# Patient Record
Sex: Female | Born: 1935 | Smoking: Never smoker
Health system: Southern US, Community
[De-identification: ages and names within clinical notes are randomized; demographics above are authoritative.]

## PROBLEM LIST (undated history)

## (undated) DIAGNOSIS — K449 Diaphragmatic hernia without obstruction or gangrene: Secondary | ICD-10-CM

## (undated) DIAGNOSIS — K5900 Constipation, unspecified: Secondary | ICD-10-CM

## (undated) DIAGNOSIS — K219 Gastro-esophageal reflux disease without esophagitis: Secondary | ICD-10-CM

## (undated) DIAGNOSIS — I10 Essential (primary) hypertension: Secondary | ICD-10-CM

## (undated) DIAGNOSIS — M5137 Other intervertebral disc degeneration, lumbosacral region: Secondary | ICD-10-CM

## (undated) DIAGNOSIS — G20A1 Parkinson's disease without dyskinesia, without mention of fluctuations: Secondary | ICD-10-CM

## (undated) DIAGNOSIS — M159 Polyosteoarthritis, unspecified: Secondary | ICD-10-CM

## (undated) DIAGNOSIS — IMO0001 Reserved for inherently not codable concepts without codable children: Secondary | ICD-10-CM

## (undated) DIAGNOSIS — I1310 Hypertensive heart and chronic kidney disease without heart failure, with stage 1 through stage 4 chronic kidney disease, or unspecified chronic kidney disease: Secondary | ICD-10-CM

## (undated) DIAGNOSIS — M51379 Other intervertebral disc degeneration, lumbosacral region without mention of lumbar back pain or lower extremity pain: Secondary | ICD-10-CM

## (undated) DIAGNOSIS — J309 Allergic rhinitis, unspecified: Secondary | ICD-10-CM

## (undated) DIAGNOSIS — E785 Hyperlipidemia, unspecified: Secondary | ICD-10-CM

## (undated) DIAGNOSIS — G20 Parkinson's disease: Secondary | ICD-10-CM

## (undated) DIAGNOSIS — I739 Peripheral vascular disease, unspecified: Secondary | ICD-10-CM

## (undated) DIAGNOSIS — N189 Chronic kidney disease, unspecified: Secondary | ICD-10-CM

## (undated) DIAGNOSIS — N390 Urinary tract infection, site not specified: Secondary | ICD-10-CM

## (undated) DIAGNOSIS — M069 Rheumatoid arthritis, unspecified: Secondary | ICD-10-CM

## (undated) DIAGNOSIS — M810 Age-related osteoporosis without current pathological fracture: Secondary | ICD-10-CM

## (undated) DIAGNOSIS — F331 Major depressive disorder, recurrent, moderate: Secondary | ICD-10-CM

## (undated) HISTORY — DX: Chronic kidney disease, unspecified: N18.9

## (undated) HISTORY — DX: Urinary tract infection, site not specified: N39.0

## (undated) HISTORY — DX: Allergic rhinitis, unspecified: J30.9

## (undated) HISTORY — DX: Peripheral vascular disease, unspecified: I73.9

## (undated) HISTORY — DX: Polyosteoarthritis, unspecified: M15.9

## (undated) HISTORY — DX: Major depressive disorder, recurrent, moderate: F33.1

## (undated) HISTORY — DX: Hypertensive heart and chronic kidney disease without heart failure, with stage 1 through stage 4 chronic kidney disease, or unspecified chronic kidney disease: I13.10

## (undated) HISTORY — DX: Reserved for inherently not codable concepts without codable children: IMO0001

## (undated) HISTORY — DX: Other intervertebral disc degeneration, lumbosacral region without mention of lumbar back pain or lower extremity pain: M51.379

## (undated) HISTORY — DX: Constipation, unspecified: K59.00

## (undated) HISTORY — DX: Essential (primary) hypertension: I10

## (undated) HISTORY — DX: Age-related osteoporosis without current pathological fracture: M81.0

## (undated) HISTORY — DX: Hyperlipidemia, unspecified: E78.5

## (undated) HISTORY — DX: Diaphragmatic hernia without obstruction or gangrene: K44.9

## (undated) HISTORY — DX: Gastro-esophageal reflux disease without esophagitis: K21.9

## (undated) HISTORY — DX: Other intervertebral disc degeneration, lumbosacral region: M51.37

## (undated) HISTORY — DX: Rheumatoid arthritis, unspecified: M06.9

## (undated) HISTORY — PX: APPENDECTOMY: SHX54

---

## 2012-04-08 DIAGNOSIS — G20 Parkinson's disease: Secondary | ICD-10-CM

## 2012-04-08 DIAGNOSIS — I1310 Hypertensive heart and chronic kidney disease without heart failure, with stage 1 through stage 4 chronic kidney disease, or unspecified chronic kidney disease: Secondary | ICD-10-CM

## 2012-04-08 DIAGNOSIS — M069 Rheumatoid arthritis, unspecified: Secondary | ICD-10-CM

## 2012-04-08 DIAGNOSIS — G20A1 Parkinson's disease without dyskinesia, without mention of fluctuations: Secondary | ICD-10-CM

## 2012-04-08 DIAGNOSIS — N189 Chronic kidney disease, unspecified: Secondary | ICD-10-CM

## 2012-04-14 ENCOUNTER — Other Ambulatory Visit: Payer: Self-pay | Admitting: *Deleted

## 2012-04-14 MED ORDER — FENTANYL 50 MCG/HR TD PT72: MEDICATED_PATCH | TRANSDERMAL | Status: DC

## 2012-05-05 ENCOUNTER — Other Ambulatory Visit: Payer: Self-pay | Admitting: *Deleted

## 2012-05-05 MED ORDER — FENTANYL 50 MCG/HR TD PT72: MEDICATED_PATCH | TRANSDERMAL | Status: DC

## 2012-05-24 ENCOUNTER — Other Ambulatory Visit: Payer: Self-pay | Admitting: *Deleted

## 2012-05-24 MED ORDER — HYDROMORPHONE HCL 4 MG PO TABS: ORAL_TABLET | ORAL | Status: DC

## 2012-06-08 ENCOUNTER — Other Ambulatory Visit: Payer: Self-pay | Admitting: *Deleted

## 2012-06-08 MED ORDER — FENTANYL 25 MCG/HR TD PT72: 1.0000 | MEDICATED_PATCH | TRANSDERMAL | Status: DC

## 2012-08-03 ENCOUNTER — Encounter: Payer: Self-pay | Admitting: Internal Medicine

## 2012-08-05 DIAGNOSIS — N390 Urinary tract infection, site not specified: Secondary | ICD-10-CM

## 2012-08-05 DIAGNOSIS — G20 Parkinson's disease: Secondary | ICD-10-CM

## 2012-08-12 DIAGNOSIS — I1310 Hypertensive heart and chronic kidney disease without heart failure, with stage 1 through stage 4 chronic kidney disease, or unspecified chronic kidney disease: Secondary | ICD-10-CM | POA: Insufficient documentation

## 2012-08-12 DIAGNOSIS — M810 Age-related osteoporosis without current pathological fracture: Secondary | ICD-10-CM | POA: Insufficient documentation

## 2012-08-12 DIAGNOSIS — IMO0001 Reserved for inherently not codable concepts without codable children: Secondary | ICD-10-CM | POA: Insufficient documentation

## 2012-08-12 DIAGNOSIS — G20 Parkinson's disease: Secondary | ICD-10-CM | POA: Insufficient documentation

## 2012-08-12 DIAGNOSIS — I739 Peripheral vascular disease, unspecified: Secondary | ICD-10-CM | POA: Insufficient documentation

## 2012-08-12 DIAGNOSIS — M5137 Other intervertebral disc degeneration, lumbosacral region: Secondary | ICD-10-CM | POA: Insufficient documentation

## 2012-08-12 DIAGNOSIS — N390 Urinary tract infection, site not specified: Secondary | ICD-10-CM | POA: Insufficient documentation

## 2012-08-12 DIAGNOSIS — M069 Rheumatoid arthritis, unspecified: Secondary | ICD-10-CM | POA: Insufficient documentation

## 2012-08-12 DIAGNOSIS — M159 Polyosteoarthritis, unspecified: Secondary | ICD-10-CM | POA: Insufficient documentation

## 2012-08-12 DIAGNOSIS — K5900 Constipation, unspecified: Secondary | ICD-10-CM | POA: Insufficient documentation

## 2012-08-12 DIAGNOSIS — N182 Chronic kidney disease, stage 2 (mild): Secondary | ICD-10-CM | POA: Insufficient documentation

## 2012-08-12 DIAGNOSIS — F331 Major depressive disorder, recurrent, moderate: Secondary | ICD-10-CM | POA: Insufficient documentation

## 2012-08-12 DIAGNOSIS — R229 Localized swelling, mass and lump, unspecified: Secondary | ICD-10-CM | POA: Insufficient documentation

## 2012-08-12 DIAGNOSIS — J309 Allergic rhinitis, unspecified: Secondary | ICD-10-CM | POA: Insufficient documentation

## 2012-08-12 DIAGNOSIS — K449 Diaphragmatic hernia without obstruction or gangrene: Secondary | ICD-10-CM | POA: Insufficient documentation

## 2012-08-29 ENCOUNTER — Other Ambulatory Visit: Payer: Self-pay

## 2012-08-29 MED ORDER — FENTANYL 25 MCG/HR TD PT72: 1.0000 | MEDICATED_PATCH | TRANSDERMAL | Status: DC

## 2012-09-07 ENCOUNTER — Other Ambulatory Visit: Payer: Self-pay

## 2012-09-07 MED ORDER — HYDROMORPHONE HCL 4 MG PO TABS: ORAL_TABLET | ORAL | Status: DC

## 2012-09-07 NOTE — Telephone Encounter (Signed)
Verified dose and instructions reflect manual request received by nursing home.   

## 2012-09-26 ENCOUNTER — Other Ambulatory Visit: Payer: Self-pay | Admitting: *Deleted

## 2012-09-26 MED ORDER — FENTANYL 25 MCG/HR TD PT72: 1.0000 | MEDICATED_PATCH | TRANSDERMAL | Status: DC

## 2012-10-03 ENCOUNTER — Other Ambulatory Visit: Payer: Self-pay | Admitting: *Deleted

## 2012-10-03 MED ORDER — FENTANYL 25 MCG/HR TD PT72: 1.0000 | MEDICATED_PATCH | TRANSDERMAL | Status: DC

## 2012-11-21 ENCOUNTER — Other Ambulatory Visit: Payer: Self-pay | Admitting: *Deleted

## 2012-11-21 MED ORDER — FENTANYL 25 MCG/HR TD PT72: 25.0000 ug | MEDICATED_PATCH | TRANSDERMAL | Status: DC

## 2012-12-05 ENCOUNTER — Other Ambulatory Visit: Payer: Self-pay | Admitting: *Deleted

## 2012-12-05 MED ORDER — HYDROMORPHONE HCL 4 MG PO TABS: ORAL_TABLET | ORAL | Status: DC

## 2012-12-19 ENCOUNTER — Other Ambulatory Visit: Payer: Self-pay | Admitting: *Deleted

## 2012-12-19 MED ORDER — FENTANYL 25 MCG/HR TD PT72: 25.0000 ug | MEDICATED_PATCH | TRANSDERMAL | Status: DC

## 2013-01-23 ENCOUNTER — Non-Acute Institutional Stay (SKILLED_NURSING_FACILITY): Payer: PRIVATE HEALTH INSURANCE | Admitting: Internal Medicine

## 2013-01-23 ENCOUNTER — Encounter: Payer: Self-pay | Admitting: Internal Medicine

## 2013-01-23 DIAGNOSIS — G20 Parkinson's disease: Secondary | ICD-10-CM

## 2013-01-23 DIAGNOSIS — I739 Peripheral vascular disease, unspecified: Secondary | ICD-10-CM

## 2013-01-23 DIAGNOSIS — N390 Urinary tract infection, site not specified: Secondary | ICD-10-CM

## 2013-01-23 DIAGNOSIS — F331 Major depressive disorder, recurrent, moderate: Secondary | ICD-10-CM

## 2013-01-23 DIAGNOSIS — N189 Chronic kidney disease, unspecified: Secondary | ICD-10-CM

## 2013-01-23 DIAGNOSIS — I1310 Hypertensive heart and chronic kidney disease without heart failure, with stage 1 through stage 4 chronic kidney disease, or unspecified chronic kidney disease: Secondary | ICD-10-CM

## 2013-01-23 NOTE — Progress Notes (Signed)
MRN: [id number] Name: Sheri Molina  Sex: female Age: 80 y.o. DOB: 04/09/1929  [facility] #: [facility] Facility/Room: 217A Level Of Care: SNF Provider: Merrilee Seashore D Emergency Contacts: Extended Emergency Contact Information Primary Emergency Contact: Contact,No  [place] Home Phone: [phone] Relation: None  Code Status: DNR  Allergies: Ampicillin  Chief Complaint  Patient presents with  . Medical Managment of Chronic Issues    HPI: Patient is 80 y.o. female who is being seen for chronic medical problems.  Past Medical History  Diagnosis Date  . Localized superficial swelling, mass, or lump   . Paralysis agitans   . Urinary tract infection, site not specified   . Allergic rhinitis, cause unspecified   . Major depressive disorder, recurrent episode, moderate   . Diaphragmatic hernia without mention of obstruction or gangrene   . 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 Chronic kidney disease, unspecified   . Unspecified constipation   . Myalgia and myositis, unspecified   . Peripheral vascular disease, unspecified   . Degeneration of lumbar or lumbosacral intervertebral disc   . Rheumatoid arthritis(714.0)   . Senile osteoporosis   . Generalized osteoarthrosis, involving multiple sites     History reviewed. No pertinent past surgical history.    Medication List       This list is accurate as of: 01/23/13  2:17 PM.  Always use your most recent med list.               APOKYN 10 MG/ML Soln  Generic drug:  APOMORPHINE HYDROCHLORIDE  Inject 0.2 mLs into the skin. up to 5 times a day prn increased parkinson's sx     ascorbic acid 1000 MG tablet  Commonly known as:  VITAMIN C  Take 1,000 mg by mouth daily.     calcium carbonate (dosed in mg elemental calcium) 1250 MG/5ML  Take 500 mg of elemental calcium by mouth.     Calcium Carbonate  Antacid 648 MG Tabs  Take 1 tablet by mouth 2 (two) times daily.     carbidopa-levodopa-entacapone 50-200-200 MG per tablet  Commonly known as:  STALEVO  Take 1 tablet by mouth 3 (three) times daily.     DULoxetine 30 MG capsule  Commonly known as:  CYMBALTA  Take 30 mg by mouth daily.     fentaNYL 25 MCG/HR patch  Commonly known as:  DURAGESIC - dosed mcg/hr  Place 1 patch (25 mcg total) onto the skin every 3 (three) days.     FISH OIL BURP-LESS 1000 MG Caps  Take 1 capsule by mouth daily.     GENTEAL 0.3 % Soln  Generic drug:  Hypromellose  Apply 1 drop to eye every 12 (twelve) hours.     HYDROmorphone 4 MG tablet  Commonly known as:  DILAUDID  Take 1 tablet every 8 hours as needed for pain     Melatonin 3 MG Tbdp  Take 1 tablet by mouth at bedtime.     metoprolol succinate 50 MG 24 hr tablet  Commonly known as:  TOPROL-XL  Take 50 mg by mouth daily. Take with or immediately following a meal.     MIRAPEX 1 MG tablet  Generic drug:  pramipexole  Take 2 mg by mouth 3 (three) times daily.     nitroGLYCERIN 0.4 MG SL tablet  Commonly known as:  NITROSTAT  Place 0.4 mg under the tongue  every 5 (five) minutes as needed for chest pain.     polyethylene glycol packet  Commonly known as:  MIRALAX / GLYCOLAX  Take 17 g by mouth daily.     predniSONE 5 MG tablet  Commonly known as:  DELTASONE  Take 5 mg by mouth daily with breakfast.     SALONPAS 1.2-5.7-6.3 % Ptch  Generic drug:  Camphor-Menthol-Methyl Sal  Apply 1 patch topically daily. Remove after 12 hours     senna 8.6 MG tablet  Commonly known as:  SENOKOT  Take 2 tablets by mouth 2 (two) times daily.     trimethoprim 100 MG tablet  Commonly known as:  TRIMPEX  Take 100 mg by mouth daily.     Vitamin D (Ergocalciferol) 50000 UNITS Caps capsule  Commonly known as:  DRISDOL  Take 50,000 Units by mouth every 28 (twenty-eight) days.        Meds ordered this encounter  Medications  . senna (SENOKOT) 8.6  MG tablet    Sig: Take 2 tablets by mouth 2 (two) times daily.  . Calcium Carbonate Antacid 648 MG TABS    Sig: Take 1 tablet by mouth 2 (two) times daily.  . nitroGLYCERIN (NITROSTAT) 0.4 MG SL tablet    Sig: Place 0.4 mg under the tongue every 5 (five) minutes as needed for chest pain.  . Camphor-Menthol-Methyl Sal (SALONPAS) 1.2-5.7-6.3 % PTCH    Sig: Apply 1 patch topically daily. Remove after 12 hours  . Hypromellose (GENTEAL) 0.3 % SOLN    Sig: Apply 1 drop to eye every 12 (twelve) hours.  . Omega-3 Fatty Acids (FISH OIL BURP-LESS) 1000 MG CAPS    Sig: Take 1 capsule by mouth daily.  Marland Kitchen ascorbic acid (VITAMIN C) 1000 MG tablet    Sig: Take 1,000 mg by mouth daily.  . metoprolol succinate (TOPROL-XL) 50 MG 24 hr tablet    Sig: Take 50 mg by mouth daily. Take with or immediately following a meal.  . predniSONE (DELTASONE) 5 MG tablet    Sig: Take 5 mg by mouth daily with breakfast.  . calcium carbonate, dosed in mg elemental calcium, 1250 MG/5ML    Sig: Take 500 mg of elemental calcium by mouth.  . DULoxetine (CYMBALTA) 30 MG capsule    Sig: Take 30 mg by mouth daily.  . Vitamin D, Ergocalciferol, (DRISDOL) 50000 UNITS CAPS capsule    Sig: Take 50,000 Units by mouth every 28 (twenty-eight) days.  Marland Kitchen trimethoprim (TRIMPEX) 100 MG tablet    Sig: Take 100 mg by mouth daily.  . polyethylene glycol (MIRALAX / GLYCOLAX) packet    Sig: Take 17 g by mouth daily.  . Melatonin 3 MG TBDP    Sig: Take 1 tablet by mouth at bedtime.  . pramipexole (MIRAPEX) 1 MG tablet    Sig: Take 2 mg by mouth 3 (three) times daily.  . carbidopa-levodopa-entacapone (STALEVO) 50-200-200 MG per tablet    Sig: Take 1 tablet by mouth 3 (three) times daily.  . APOMORPHINE HYDROCHLORIDE (APOKYN) 10 MG/ML SOLN    Sig: Inject 0.2 mLs into the skin. up to 5 times a day prn increased parkinson's sx     There is no immunization history on file for this patient.  History  Substance Use Topics  . Smoking status:  Not on file  . Smokeless tobacco: Not on file  . Alcohol Use: Not on file    Review of Systems  DATA OBTAINED: from patient ;NO C/O BUT NEEDS  HER APOKYN PARKINSONS MED GENERAL: Feels well no fevers, fatigue, appetite changes SKIN: No itching, rash HEENT: No complaint RESPIRATORY: No cough, wheezing, SOB CARDIAC: No chest pain, palpitations, lower extremity edema  GI: No abdominal pain, No N/V/D or constipation, No heartburn or reflux  GU: No dysuria, frequency or urgency, or incontinence  MUSCULOSKELETAL: STIFFNESS NEUROLOGIC: No headache, dizziness or focal weakness PSYCHIATRIC: No overt anxiety or sadness. Sleeps well.   Filed Vitals:   01/23/13 1324  BP: 126/86  Pulse: 59  Temp: 97.2 F (36.2 C)  Resp: 20    Physical Exam  GENERAL APPEARANCE: Alert, conversant. Appropriately groomed. No acute distress  SKIN: No diaphoresis rash,  HEENT: Unremarkable RESPIRATORY: Breathing is even, unlabored. Lung sounds are clear   CARDIOVASCULAR: Heart RRR no murmurs, rubs or gallops. No peripheral edema  GASTROINTESTINAL: Abdomen is soft, non-tender, not distended w/ normal bowel sounds.  GENITOURINARY: Bladder non tender, not distended  MUSCULOSKELETAL: MILD-MOD CONTRACTURES;   NEUROLOGIC: Cranial nerves 2-12 grossly intact. Moves all extremities; mild - mod inv movements; not usual tremor PSYCHIATRIC: Mood and affect appropriate to situation, no behavioral issues  Patient Active Problem List   Diagnosis Date Noted  . Localized superficial swelling, mass, or lump   . Paralysis agitans   . Urinary tract infection, site not specified   . Allergic rhinitis, cause unspecified   . Major depressive disorder, recurrent episode, moderate   . Diaphragmatic hernia without mention of obstruction or gangrene   . 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 Chronic kidney disease,  unspecified   . Unspecified constipation   . Myalgia and myositis, unspecified   . Peripheral vascular disease, unspecified   . Degeneration of lumbar or lumbosacral intervertebral disc   . Rheumatoid arthritis(714.0)   . Senile osteoporosis   . 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 and with chronic kidney disease stage I through stage IV, or unspecified(404.90) BP well controlled on Toprol XL 50 mg-will continue  Peripheral vascular disease, unspecified Not on statin but 12/17 LDL 105, HDL 53, so acceptable;can't be on ASA because of chronic prednisone;BP well controlled  Chronic kidney disease, unspecified CrCl 50, GFR 87; 12/17 BUN 18/Cr 0.78;at baseline, stable  Major depressive disorder, recurrent episode, moderate Controled on 30 mg cymbalta which is a lower dose than prior  Urinary tract infection, site not specified Pt has h/o recurrent for which she takes trimethoprim as prophylaxis; in late December pt had a recurrent UTO for which she was treated with Bactrim DS 1 tab daily for 10 days with sx resolution  Paralysis agitans Controlled fairly well but getting apokyn has been a problem-will speak  with Seward Meth about this    Margit Hanks, MD

## 2013-01-23 NOTE — Assessment & Plan Note (Signed)
Pt has h/o recurrent for which she takes trimethoprim as prophylaxis; in late December pt had a recurrent UTO for which she was treated with Bactrim DS 1 tab daily for 10 days with sx resolution

## 2013-01-23 NOTE — Assessment & Plan Note (Signed)
Controled on 30 mg cymbalta which is a lower dose than prior

## 2013-01-23 NOTE — Assessment & Plan Note (Signed)
CrCl 50, GFR 87; 12/17 BUN 18/Cr 0.78;at baseline, stable

## 2013-01-23 NOTE — Assessment & Plan Note (Signed)
BP well controlled on Toprol XL 50 mg-will continue

## 2013-01-23 NOTE — Assessment & Plan Note (Signed)
Not on statin but 12/17 LDL 105, HDL 53, so acceptable;can't be on ASA because of chronic prednisone;BP well controlled

## 2013-01-23 NOTE — Assessment & Plan Note (Signed)
Controlled fairly well but getting apokyn has been a problem-will speak  with Seward Meth about this

## 2013-02-09 ENCOUNTER — Non-Acute Institutional Stay (SKILLED_NURSING_FACILITY): Payer: PRIVATE HEALTH INSURANCE | Admitting: Internal Medicine

## 2013-02-09 DIAGNOSIS — M810 Age-related osteoporosis without current pathological fracture: Secondary | ICD-10-CM

## 2013-02-09 DIAGNOSIS — M069 Rheumatoid arthritis, unspecified: Secondary | ICD-10-CM

## 2013-02-09 DIAGNOSIS — I1310 Hypertensive heart and chronic kidney disease without heart failure, with stage 1 through stage 4 chronic kidney disease, or unspecified chronic kidney disease: Secondary | ICD-10-CM

## 2013-02-09 DIAGNOSIS — K5900 Constipation, unspecified: Secondary | ICD-10-CM

## 2013-02-09 DIAGNOSIS — G20 Parkinson's disease: Secondary | ICD-10-CM

## 2013-02-09 DIAGNOSIS — R300 Dysuria: Secondary | ICD-10-CM

## 2013-02-23 ENCOUNTER — Encounter: Payer: Self-pay | Admitting: Internal Medicine

## 2013-02-23 NOTE — Progress Notes (Signed)
MRN: [id number] Name: Sheri Molina  Sex: female Age: 80 y.o. DOB: Jul 28, 1929  [facility] #: [facility] Facility/Room:  227B Level Of Care: SNF Provider: Merrilee Seashore D Emergency Contacts: Extended Emergency Contact Information Primary Emergency Contact: Contact,No  [place] Home Phone: [phone] Relation: None  Code Status: DNR  Allergies: Ampicillin  Chief Complaint  Patient presents with  . Medical Managment of Chronic Issues    HPI: Patient is 80 y.o. female who is being seen for routine medical problems.  Past Medical History  Diagnosis Date  . Localized superficial swelling, mass, or lump   . Paralysis agitans   . Urinary tract infection, site not specified   . Allergic rhinitis, cause unspecified   . Major depressive disorder, recurrent episode, moderate   . Diaphragmatic hernia without mention of obstruction or gangrene   . 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 Chronic kidney disease, unspecified   . Unspecified constipation   . Myalgia and myositis, unspecified   . Peripheral vascular disease, unspecified   . Degeneration of lumbar or lumbosacral intervertebral disc   . Rheumatoid arthritis(714.0)   . Senile osteoporosis   . Generalized osteoarthrosis, involving multiple sites     History reviewed. No pertinent past surgical history.    Medication List       This list is accurate as of: 02/09/13 11:59 PM.  Always use your most recent med list.               APOKYN 10 MG/ML Soln  Generic drug:  APOMORPHINE HYDROCHLORIDE  Inject 0.2 mLs into the skin. up to 5 times a day prn increased parkinson's sx     ascorbic acid 1000 MG tablet  Commonly known as:  VITAMIN C  Take 1,000 mg by mouth daily.     calcium carbonate (dosed in mg elemental calcium) 1250 MG/5ML  Take 500 mg of elemental calcium by mouth.     Calcium Carbonate  Antacid 648 MG Tabs  Take 1 tablet by mouth 2 (two) times daily.     carbidopa-levodopa-entacapone 50-200-200 MG per tablet  Commonly known as:  STALEVO  Take 1 tablet by mouth 3 (three) times daily.     DULoxetine 30 MG capsule  Commonly known as:  CYMBALTA  Take 30 mg by mouth daily.     fentaNYL 25 MCG/HR patch  Commonly known as:  DURAGESIC - dosed mcg/hr  Place 1 patch (25 mcg total) onto the skin every 3 (three) days.     FISH OIL BURP-LESS 1000 MG Caps  Take 1 capsule by mouth daily.     GENTEAL 0.3 % Soln  Generic drug:  Hypromellose  Apply 1 drop to eye every 12 (twelve) hours.     HYDROmorphone 4 MG tablet  Commonly known as:  DILAUDID  Take 1 tablet every 8 hours as needed for pain     Melatonin 3 MG Tbdp  Take 1 tablet by mouth at bedtime.     metoprolol succinate 50 MG 24 hr tablet  Commonly known as:  TOPROL-XL  Take 50 mg by mouth daily. Take with or immediately following a meal.     MIRAPEX 1 MG tablet  Generic drug:  pramipexole  Take 2 mg by mouth 3 (three) times daily.     nitroGLYCERIN 0.4 MG SL tablet  Commonly known as:  NITROSTAT  Place 0.4 mg under the tongue  every 5 (five) minutes as needed for chest pain.     polyethylene glycol packet  Commonly known as:  MIRALAX / GLYCOLAX  Take 17 g by mouth daily.     predniSONE 5 MG tablet  Commonly known as:  DELTASONE  Take 5 mg by mouth daily with breakfast.     SALONPAS 1.2-5.7-6.3 % Ptch  Generic drug:  Camphor-Menthol-Methyl Sal  Apply 1 patch topically daily. Remove after 12 hours     senna 8.6 MG tablet  Commonly known as:  SENOKOT  Take 2 tablets by mouth 2 (two) times daily.     trimethoprim 100 MG tablet  Commonly known as:  TRIMPEX  Take 100 mg by mouth daily.     Vitamin D (Ergocalciferol) 50000 UNITS Caps capsule  Commonly known as:  DRISDOL  Take 50,000 Units by mouth every 28 (twenty-eight) days.        No orders of the defined types were placed in this encounter.      There is no immunization history on file for this patient.  History  Substance Use Topics  . Smoking status: Unknown If Ever Smoked  . Smokeless tobacco: Not on file  . Alcohol Use: No    Review of Systems  DATA OBTAINED: from patient GENERAL: Feels well no fevers, fatigue, appetite changes SKIN: No itching, rash HEENT: No complaint RESPIRATORY: No cough, wheezing, SOB CARDIAC: No chest pain, palpitations, lower extremity edema  GI: No abdominal pain, No N/V/D or constipation, No heartburn or reflux  GU: + dysuria, no frequency or urgency, or incontinence  MUSCULOSKELETAL: No unrelieved bone/joint pain NEUROLOGIC: No headache, dizziness or focal weakness PSYCHIATRIC: No overt anxiety or sadness. Sleeps well.   Filed Vitals:   02/23/13 0945  BP: 132/68  Pulse: 70  Temp: 97 F (36.1 C)  Resp: 19    Physical Exam  GENERAL APPEARANCE: Alert, conversant. Appropriately groomed. No acute distress  SKIN: No diaphoresis rash HEENT: Unremarkable RESPIRATORY: Breathing is even, unlabored. Lung sounds are clear   CARDIOVASCULAR: Heart RRR no murmurs, rubs or gallops. No peripheral edema  GASTROINTESTINAL: Abdomen is soft, non-tender, not distended w/ normal bowel sounds.  GENITOURINARY: Bladder non tender, not distended  MUSCULOSKELETAL: deformity of spine and extremities with contractures NEUROLOGIC: Cranial nerves 2-12 grossly intact. Moves all extremities, muscle rigidity and inv  Movements to all extremities. PSYCHIATRIC: Mood and affect appropriate to situation, no behavioral issues  Patient Active Problem List   Diagnosis Date Noted  . Localized superficial swelling, mass, or lump   . Paralysis agitans   . Urinary tract infection, site not specified   . Allergic rhinitis, cause unspecified   . Major depressive disorder, recurrent episode, moderate   . Diaphragmatic hernia without mention of obstruction or gangrene   . Unspecified hypertensive heart and kidney  disease without heart failure and with chronic kidney disease stage I through stage IV, or unspecified(404.90)   . Chronic kidney disease, unspecified   . Unspecified constipation   . Myalgia and myositis, unspecified   . Peripheral vascular disease, unspecified   . Degeneration of lumbar or lumbosacral intervertebral disc   . Rheumatoid arthritis(714.0)   . Senile osteoporosis   . Generalized osteoarthrosis, involving multiple sites        Assessment and Plan  Unspecified constipation Pt on senna and miralax for control;will continue  Paralysis agitans Chronic,progressive; movements controled by stalevo and mirapax and pain/stiffness with apokyn injections up to 5 times a day per neuro.  Rheumatoid arthritis(714.0)  Takes prednisone 5 mg daily for control and fentanyl for pain, with prn dilaudid  Senile osteoporosis Continue calcium and Vit D; prednisone adds risk. 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

## 2013-02-23 NOTE — Assessment & Plan Note (Signed)
Pt on senna and miralax for control;will continue

## 2013-02-23 NOTE — Assessment & Plan Note (Signed)
Chronic,progressive; movements controled by stalevo and mirapax and pain/stiffness with apokyn injections up to 5 times a day per neuro.

## 2013-02-23 NOTE — Assessment & Plan Note (Signed)
Takes prednisone 5 mg daily for control and fentanyl for pain, with prn dilaudid

## 2013-02-23 NOTE — Assessment & Plan Note (Signed)
Continue calcium and Vit D; prednisone adds risk. Biphophonates not options 2/2 GERD and dysphagia

## 2013-02-23 NOTE — Assessment & Plan Note (Signed)
Still controlled on Toprol

## 2013-02-27 ENCOUNTER — Other Ambulatory Visit: Payer: Self-pay | Admitting: *Deleted

## 2013-02-27 MED ORDER — HYDROMORPHONE HCL 4 MG PO TABS: ORAL_TABLET | ORAL | Status: DC

## 2013-02-27 NOTE — Telephone Encounter (Signed)
[facility] 

## 2013-03-09 ENCOUNTER — Other Ambulatory Visit: Payer: Self-pay | Admitting: *Deleted

## 2013-03-09 MED ORDER — FENTANYL 25 MCG/HR TD PT72: 25.0000 ug | MEDICATED_PATCH | TRANSDERMAL | Status: DC

## 2013-03-09 NOTE — Telephone Encounter (Signed)
[facility] 

## 2013-04-17 ENCOUNTER — Other Ambulatory Visit: Payer: Self-pay | Admitting: *Deleted

## 2013-04-17 MED ORDER — FENTANYL 25 MCG/HR TD PT72: MEDICATED_PATCH | TRANSDERMAL | Status: DC

## 2013-04-17 NOTE — Telephone Encounter (Signed)
[facility] 

## 2013-05-03 ENCOUNTER — Other Ambulatory Visit: Payer: Self-pay | Admitting: *Deleted

## 2013-05-03 MED ORDER — HYDROMORPHONE HCL 4 MG PO TABS: ORAL_TABLET | ORAL | Status: DC

## 2013-05-03 NOTE — Telephone Encounter (Signed)
[facility] 

## 2013-05-08 ENCOUNTER — Non-Acute Institutional Stay (SKILLED_NURSING_FACILITY): Payer: PRIVATE HEALTH INSURANCE | Admitting: Internal Medicine

## 2013-05-08 DIAGNOSIS — K219 Gastro-esophageal reflux disease without esophagitis: Secondary | ICD-10-CM

## 2013-05-08 DIAGNOSIS — N182 Chronic kidney disease, stage 2 (mild): Secondary | ICD-10-CM

## 2013-05-08 DIAGNOSIS — F331 Major depressive disorder, recurrent, moderate: Secondary | ICD-10-CM

## 2013-05-08 DIAGNOSIS — E785 Hyperlipidemia, unspecified: Secondary | ICD-10-CM

## 2013-05-08 DIAGNOSIS — M069 Rheumatoid arthritis, unspecified: Secondary | ICD-10-CM

## 2013-05-08 DIAGNOSIS — I10 Essential (primary) hypertension: Secondary | ICD-10-CM

## 2013-05-08 DIAGNOSIS — G20 Parkinson's disease: Secondary | ICD-10-CM

## 2013-05-10 ENCOUNTER — Encounter: Payer: Self-pay | Admitting: Internal Medicine

## 2013-05-10 DIAGNOSIS — E785 Hyperlipidemia, unspecified: Secondary | ICD-10-CM | POA: Insufficient documentation

## 2013-05-10 DIAGNOSIS — K219 Gastro-esophageal reflux disease without esophagitis: Secondary | ICD-10-CM | POA: Insufficient documentation

## 2013-05-10 DIAGNOSIS — I10 Essential (primary) hypertension: Secondary | ICD-10-CM | POA: Insufficient documentation

## 2013-05-10 NOTE — Assessment & Plan Note (Signed)
Continue toprol-XL

## 2013-05-10 NOTE — Assessment & Plan Note (Signed)
Still continuing cymbalta

## 2013-05-10 NOTE — Progress Notes (Signed)
MRN: [id number] Name: Sheri Molina  Sex: female Age: 80 y.o. DOB: March 23, 1929  [facility] Facility/Room: 227B Level Of Care: SNF Provider: Margit Hanks Emergency Contacts: Extended Emergency Contact Information Primary Emergency Contact: Contact,No  [place] Home Phone: [phone] Relation: None  Code Status:DNR   Allergies: Ampicillin  Chief Complaint  Patient presents with  . Medical Management of Chronic Issues    HPI: Patient is 80 y.o. female who is being seen for routine problems.  Past Medical History  Diagnosis Date  . Localized superficial swelling, mass, or lump   . Paralysis agitans   . Urinary tract infection, site not specified   . Allergic rhinitis, cause unspecified   . Diaphragmatic hernia without mention of obstruction or gangrene   . 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 Chronic kidney disease, unspecified   . Unspecified constipation   . Myalgia and myositis, unspecified   . Peripheral vascular disease, unspecified   . Degeneration of lumbar or lumbosacral intervertebral disc   . Rheumatoid arthritis(714.0)   . Senile osteoporosis   . Generalized osteoarthrosis, involving multiple sites   . GERD (gastroesophageal reflux disease)   . Major depressive disorder, recurrent episode, moderate   . Hypertension   . Hyperlipidemia     History reviewed. No pertinent past surgical history.    Medication List       This list is accurate as of: 05/08/13 11:59 PM.  Always use your most recent med list.               APOKYN 10 MG/ML Soln  Generic drug:  APOMORPHINE HYDROCHLORIDE  Inject 0.2 mLs into the skin. up to 5 times a day prn increased parkinson's sx     ascorbic acid 1000 MG tablet  Commonly known as:  VITAMIN C  Take 1,000 mg by mouth daily.     calcium carbonate (dosed in mg elemental calcium) 1250  MG/5ML  Take 500 mg of elemental calcium by mouth.     Calcium Carbonate Antacid 648 MG Tabs  Take 1 tablet by mouth 2 (two) times daily.     carbidopa-levodopa-entacapone 50-200-200 MG per tablet  Commonly known as:  STALEVO  Take 1 tablet by mouth 3 (three) times daily.     DULoxetine 30 MG capsule  Commonly known as:  CYMBALTA  Take 30 mg by mouth daily.     fentaNYL 25 MCG/HR patch  Commonly known as:  DURAGESIC - dosed mcg/hr  Apply one patch topically every 72 hours. Remove old patch before placement. Rotate sites between left and right chest only     FISH OIL BURP-LESS 1000 MG Caps  Take 1 capsule by mouth daily.     GENTEAL 0.3 % Soln  Generic drug:  Hypromellose  Apply 1 drop to eye every 12 (twelve) hours.     HYDROmorphone 4 MG tablet  Commonly known as:  DILAUDID  Take 1 tablet every 8 hours as needed for pain. One tablet per shift     Melatonin 3 MG Tbdp  Take 1 tablet by mouth at bedtime.     metoprolol succinate 50 MG 24 hr tablet  Commonly known as:  TOPROL-XL  Take 50 mg by mouth daily. Take with or immediately following a meal.     MIRAPEX 1 MG tablet  Generic drug:  pramipexole  Take 2 mg by mouth 3 (three)  times daily.     nitroGLYCERIN 0.4 MG SL tablet  Commonly known as:  NITROSTAT  Place 0.4 mg under the tongue every 5 (five) minutes as needed for chest pain.     polyethylene glycol packet  Commonly known as:  MIRALAX / GLYCOLAX  Take 17 g by mouth daily.     predniSONE 5 MG tablet  Commonly known as:  DELTASONE  Take 5 mg by mouth daily with breakfast.     SALONPAS 1.2-5.7-6.3 % Ptch  Generic drug:  Camphor-Menthol-Methyl Sal  Apply 1 patch topically daily. Remove after 12 hours     senna 8.6 MG tablet  Commonly known as:  SENOKOT  Take 2 tablets by mouth 2 (two) times daily.     trimethoprim 100 MG tablet  Commonly known as:  TRIMPEX  Take 100 mg by mouth daily.     Vitamin D (Ergocalciferol) 50000 UNITS Caps capsule  Commonly  known as:  DRISDOL  Take 50,000 Units by mouth every 28 (twenty-eight) days.        No orders of the defined types were placed in this encounter.     There is no immunization history on file for this patient.  History  Substance Use Topics  . Smoking status: Unknown If Ever Smoked  . Smokeless tobacco: Not on file  . Alcohol Use: No    Review of Systems  DATA OBTAINED: from patient GENERAL: Feels well no fevers, fatigue, appetite changes SKIN: No itching, rash HEENT: No complaint RESPIRATORY: No cough, wheezing, SOB CARDIAC: No chest pain, palpitations, lower extremity edema  GI: No abdominal pain, No N/V/D or constipation, No heartburn or reflux  GU: No dysuria, frequency or urgency, or incontinence  MUSCULOSKELETAL: pain and stiffness unrelieved by usual meds-we discussed this at length NEUROLOGIC: No headache, dizziness or focal weakness PSYCHIATRIC: No overt anxiety or sadness. Sleeps well.   Filed Vitals:   05/08/13 2140  BP: 110/64  Pulse: 79  Temp: 96.7 F (35.9 C)  Resp: 20    Physical Exam  GENERAL APPEARANCE: Alert, conversant. Appropriately groomed. mild distress  SKIN: No diaphoresis rash HEENT: Unremarkable RESPIRATORY: Breathing is even, unlabored. Lung sounds are clear   CARDIOVASCULAR: Heart RRR no murmurs, rubs or gallops. No peripheral edema  GASTROINTESTINAL: Abdomen is soft, non-tender, not distended w/ normal bowel sounds.  GENITOURINARY: Bladder non tender, not distended  MUSCULOSKELETAL: No abnormal joints or musculature NEUROLOGIC: Cranial nerves 2-12 grossly intact; all extremities with decreased ROM/tightness;continuous  tremor. PSYCHIATRIC: Mood and affect appropriate to situation, no behavioral issues  Patient Active Problem List   Diagnosis Date Noted  . GERD (gastroesophageal reflux disease)   . Hypertension   . Hyperlipidemia   . Localized superficial swelling, mass, or lump   . Paralysis agitans   . Urinary tract infection,  site not specified   . Allergic rhinitis, cause unspecified   . Major depressive disorder, recurrent episode, moderate   . Diaphragmatic hernia without mention of obstruction or gangrene   . 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 Chronic kidney disease, stage 2, mildly decreased GFR   . Unspecified constipation   . Myalgia and myositis, unspecified   . Peripheral vascular disease, unspecified   . Degeneration of lumbar or lumbosacral intervertebral disc   . Rheumatoid arthritis(714.0)   . Senile osteoporosis   . Generalized osteoarthrosis, involving multiple sites  Assessment and Plan  Paralysis agitans Per pt sx are no longer controlled, especially the shots she would get when muscles got tight-they no longer work, per nursing for past few weeks;this is a change, will try to get appointment with Parkinsons MD soon. Pt currently being seen in [place], and would like to start seeing [place] doctor.  Rheumatoid arthritis(714.0) Pt has not seen a rheumatologist-will get appt set up for pt.  GERD (gastroesophageal reflux disease) Pt discontinued her omeprazole herself. She has not had problems  Chronic kidney disease, stage 2, mildly decreased GFR Still stable  Major depressive disorder, recurrent episode, moderate Still continuing cymbalta  Hypertension Continue toprol-XL  Hyperlipidemia Omega 3 fatty acids-to continue    Margit Hanks, MD

## 2013-05-10 NOTE — Assessment & Plan Note (Signed)
Per pt sx are no longer controlled, especially the shots she would get when muscles got tight-they no longer work, per nursing for past few weeks;this is a change, will try to get appointment with Parkinsons MD soon. Pt currently being seen in [place], and would like to start seeing [place] doctor.

## 2013-05-10 NOTE — Assessment & Plan Note (Signed)
Still stable

## 2013-05-10 NOTE — Assessment & Plan Note (Signed)
Pt discontinued her omeprazole herself. She has not had problems

## 2013-05-10 NOTE — Assessment & Plan Note (Signed)
Pt has not seen a rheumatologist-will get appt set up for pt.

## 2013-05-10 NOTE — Assessment & Plan Note (Signed)
Omega 3 fatty acids-to continue

## 2013-05-17 ENCOUNTER — Other Ambulatory Visit: Payer: Self-pay | Admitting: *Deleted

## 2013-05-17 MED ORDER — FENTANYL 25 MCG/HR TD PT72: MEDICATED_PATCH | TRANSDERMAL | Status: DC

## 2013-05-17 NOTE — Telephone Encounter (Signed)
[facility] 

## 2013-06-12 ENCOUNTER — Other Ambulatory Visit: Payer: Self-pay | Admitting: *Deleted

## 2013-06-12 MED ORDER — FENTANYL 25 MCG/HR TD PT72: MEDICATED_PATCH | TRANSDERMAL | Status: DC

## 2013-06-12 NOTE — Telephone Encounter (Signed)
[facility] 

## 2013-07-11 ENCOUNTER — Other Ambulatory Visit: Payer: Self-pay | Admitting: *Deleted

## 2013-07-11 MED ORDER — FENTANYL 25 MCG/HR TD PT72: MEDICATED_PATCH | TRANSDERMAL | Status: DC

## 2013-08-01 ENCOUNTER — Other Ambulatory Visit: Payer: Self-pay | Admitting: *Deleted

## 2013-08-01 MED ORDER — HYDROMORPHONE HCL 4 MG PO TABS: ORAL_TABLET | ORAL | Status: DC

## 2013-08-01 NOTE — Telephone Encounter (Signed)
[facility]  

## 2013-08-14 ENCOUNTER — Other Ambulatory Visit: Payer: Self-pay | Admitting: *Deleted

## 2013-08-14 MED ORDER — FENTANYL 25 MCG/HR TD PT72: MEDICATED_PATCH | TRANSDERMAL | Status: DC

## 2013-08-14 NOTE — Telephone Encounter (Signed)
[facility] 

## 2013-09-07 ENCOUNTER — Other Ambulatory Visit: Payer: Self-pay | Admitting: *Deleted

## 2013-09-07 MED ORDER — FENTANYL 25 MCG/HR TD PT72: MEDICATED_PATCH | TRANSDERMAL | Status: DC

## 2013-09-07 NOTE — Telephone Encounter (Signed)
[facility] 

## 2013-09-07 NOTE — Telephone Encounter (Signed)
[facility] 

## 2013-10-08 ENCOUNTER — Non-Acute Institutional Stay (SKILLED_NURSING_FACILITY): Payer: PRIVATE HEALTH INSURANCE | Admitting: Internal Medicine

## 2013-10-08 ENCOUNTER — Encounter: Payer: Self-pay | Admitting: Internal Medicine

## 2013-10-08 DIAGNOSIS — G20 Parkinson's disease: Secondary | ICD-10-CM

## 2013-10-08 DIAGNOSIS — R1311 Dysphagia, oral phase: Secondary | ICD-10-CM | POA: Insufficient documentation

## 2013-10-08 DIAGNOSIS — F331 Major depressive disorder, recurrent, moderate: Secondary | ICD-10-CM

## 2013-10-08 DIAGNOSIS — M069 Rheumatoid arthritis, unspecified: Secondary | ICD-10-CM

## 2013-10-08 DIAGNOSIS — M810 Age-related osteoporosis without current pathological fracture: Secondary | ICD-10-CM

## 2013-10-08 NOTE — Assessment & Plan Note (Signed)
Pt has been on cymbalta 60 mg and is stable;will consider dose reduction next pharmacy req

## 2013-10-08 NOTE — Assessment & Plan Note (Signed)
Pt is receiving ST to be able to upgrade diet

## 2013-10-08 NOTE — Progress Notes (Signed)
MRN: [id number] Name: Sheri Molina  Sex: female Age: 80 y.o. DOB: 1929/05/29  [facility] Facility/Room:218A Level Of Care: SNF Provider: Merrilee Seashore D Emergency Contacts: Extended Emergency Contact Information Primary Emergency Contact: Contact,No  [place] Home Phone: [phone] Relation: None  Code Status: DNR  Allergies: Ampicillin  Chief Complaint  Patient presents with  . Medical Management of Chronic Issues    HPI: Patient is 80 y.o. female who is being seen for routine problems.  Past Medical History  Diagnosis Date  . Localized superficial swelling, mass, or lump   . Paralysis agitans   . Urinary tract infection, site not specified   . Allergic rhinitis, cause unspecified   . Diaphragmatic hernia without mention of obstruction or gangrene   . 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 Chronic kidney disease, unspecified   . Unspecified constipation   . Myalgia and myositis, unspecified   . Peripheral vascular disease, unspecified   . Degeneration of lumbar or lumbosacral intervertebral disc   . Rheumatoid arthritis(714.0)   . Senile osteoporosis   . Generalized osteoarthrosis, involving multiple sites   . GERD (gastroesophageal reflux disease)   . Major depressive disorder, recurrent episode, moderate   . Hypertension   . Hyperlipidemia     History reviewed. No pertinent past surgical history.    Medication List       This list is accurate as of: 10/08/13  8:45 PM.  Always use your most recent med list.               APOKYN 10 MG/ML Soln  Generic drug:  APOMORPHINE HYDROCHLORIDE  Inject 0.2 mLs into the skin. up to 5 times a day prn increased parkinson's sx     ascorbic acid 1000 MG tablet  Commonly known as:  VITAMIN C  Take 1,000 mg by mouth daily.     calcium carbonate (dosed in mg elemental calcium) 1250  MG/5ML  Take 500 mg of elemental calcium by mouth.     Calcium Carbonate Antacid 648 MG Tabs  Take 1 tablet by mouth 2 (two) times daily.     carbidopa-levodopa-entacapone 50-200-200 MG per tablet  Commonly known as:  STALEVO  Take 1 tablet by mouth 3 (three) times daily.     DULoxetine 30 MG capsule  Commonly known as:  CYMBALTA  Take 30 mg by mouth daily.     fentaNYL 25 MCG/HR patch  Commonly known as:  DURAGESIC - dosed mcg/hr  Apply one patch topically every 72 hours. Remove old patch before placement. Rotate sites     FISH OIL BURP-LESS 1000 MG Caps  Take 1 capsule by mouth daily.     GENTEAL 0.3 % Soln  Generic drug:  Hypromellose  Apply 1 drop to eye every 12 (twelve) hours.     HYDROmorphone 4 MG tablet  Commonly known as:  DILAUDID  Take one tablet by mouth every 8 hours as needed for pain. One tablet per shift.     Melatonin 3 MG Tbdp  Take 1 tablet by mouth at bedtime.     metoprolol succinate 50 MG 24 hr tablet  Commonly known as:  TOPROL-XL  Take 50 mg by mouth daily. Take with or immediately following a meal.     MIRAPEX 1 MG tablet  Generic drug:  pramipexole  Take 2 mg by mouth 3 (three) times daily.  nitroGLYCERIN 0.4 MG SL tablet  Commonly known as:  NITROSTAT  Place 0.4 mg under the tongue every 5 (five) minutes as needed for chest pain.     polyethylene glycol packet  Commonly known as:  MIRALAX / GLYCOLAX  Take 17 g by mouth daily.     predniSONE 5 MG tablet  Commonly known as:  DELTASONE  Take 5 mg by mouth daily with breakfast.     SALONPAS 1.2-5.7-6.3 % Ptch  Generic drug:  Camphor-Menthol-Methyl Sal  Apply 1 patch topically daily. Remove after 12 hours     senna 8.6 MG tablet  Commonly known as:  SENOKOT  Take 2 tablets by mouth 2 (two) times daily.     trimethoprim 100 MG tablet  Commonly known as:  TRIMPEX  Take 100 mg by mouth daily.     Vitamin D (Ergocalciferol) 50000 UNITS Caps capsule  Commonly known as:  DRISDOL   Take 50,000 Units by mouth every 28 (twenty-eight) days.        No orders of the defined types were placed in this encounter.     There is no immunization history on file for this patient.  History  Substance Use Topics  . Smoking status: Unknown If Ever Smoked  . Smokeless tobacco: Not on file  . Alcohol Use: No    Review of Systems  DATA OBTAINED: from patient GENERAL:  no fevers, fatigue, appetite changes SKIN: No itching, rash HEENT: No complaint RESPIRATORY: No cough, wheezing, SOB CARDIAC: No chest pain, palpitations, lower extremity edema  GI: No abdominal pain, No N/V/D or constipation, No heartburn or reflux  GU: No dysuria, frequency or urgency, or incontinence  MUSCULOSKELETAL: some unrelieved bone/joint pain NEUROLOGIC: No headache, dizziness  PSYCHIATRIC: No overt anxiety or sadness  Filed Vitals:   10/08/13 2011  BP: 95/64  Pulse: 74  Temp: 97.9 F (36.6 C)  Resp: 18    Physical Exam  GENERAL APPEARANCE: Alert, conversant, No acute distress  SKIN: No diaphoresis rash HEENT: Unremarkable RESPIRATORY: Breathing is even, unlabored. Lung sounds are clear   CARDIOVASCULAR: Heart RRR no murmurs, rubs or gallops. No peripheral edema  GASTROINTESTINAL: Abdomen is soft, non-tender, not distended w/ normal bowel sounds.  GENITOURINARY: Bladder non tender, not distended  MUSCULOSKELETAL: multiple deformities and contractures that pt is able to work around NEUROLOGIC: Cranial nerves 2-12 grossly intact. Moves all extremities PSYCHIATRIC: Mood and affect appropriate to situation, no behavioral issues  Patient Active Problem List   Diagnosis Date Noted  . Dysphagia, oral phase 10/08/2013  . GERD (gastroesophageal reflux disease)   . Hypertension   . Hyperlipidemia   . Localized superficial swelling, mass, or lump   . Paralysis agitans   . Urinary tract infection, site not specified   . Allergic rhinitis, cause unspecified   . Major depressive  disorder, recurrent episode, moderate   . Diaphragmatic hernia without mention of obstruction or gangrene   . 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 Chronic kidney disease, stage 2, mildly decreased GFR   . Unspecified constipation   . Myalgia and myositis, unspecified   . Peripheral vascular disease, unspecified   . Degeneration of lumbar or lumbosacral intervertebral disc   . Rheumatoid arthritis involving multiple joints   . Senile osteoporosis   . Generalized osteoarthrosis, involving multiple sites        Assessment and Plan  Paralysis agitans Being followed  by Dr Bari Mantis is chronic and progressing; Continue stalevo50 TID,mirapex 2mg  TID and PRN apokyn2 mg;next f/u is Dec or prn  Rheumatoid arthritis involving multiple joints Pt is on prednisone 5 mg daily and fentanyl patch; has had rheumatology appt set up but hasn't been seen X2 ; will persisit in getting her seen  Major depressive disorder, recurrent episode, moderate Pt has been on cymbalta 60 mg and is stable;will consider dose reduction next pharmacy req  Senile osteoporosis Continue pt Vit d and ca+  Dysphagia, oral phase Pt is receiving ST to be able to upgrade diet   Pt seen 10/05/2013 12/05/2013, MD

## 2013-10-08 NOTE — Assessment & Plan Note (Signed)
Being followed by Dr Bari Mantis is chronic and progressing; Continue stalevo50 TID,mirapex 2mg  TID and PRN apokyn2 mg;next f/u is Dec or prn

## 2013-10-08 NOTE — Assessment & Plan Note (Signed)
Pt is on prednisone 5 mg daily and fentanyl patch; has had rheumatology appt set up but hasn't been seen X2 ; will persisit in getting her seen

## 2013-10-08 NOTE — Assessment & Plan Note (Signed)
Continue pt Vit d and ca+

## 2013-11-06 ENCOUNTER — Other Ambulatory Visit: Payer: Self-pay | Admitting: *Deleted

## 2013-11-06 MED ORDER — FENTANYL 25 MCG/HR TD PT72: MEDICATED_PATCH | TRANSDERMAL | Status: DC

## 2013-11-06 NOTE — Telephone Encounter (Signed)
[facility]

## 2013-12-11 ENCOUNTER — Other Ambulatory Visit: Payer: Self-pay | Admitting: *Deleted

## 2013-12-11 MED ORDER — FENTANYL 25 MCG/HR TD PT72: MEDICATED_PATCH | TRANSDERMAL | Status: DC

## 2013-12-11 NOTE — Telephone Encounter (Signed)
[facility] 

## 2014-01-24 ENCOUNTER — Encounter: Payer: Self-pay | Admitting: Internal Medicine

## 2014-01-24 ENCOUNTER — Non-Acute Institutional Stay (SKILLED_NURSING_FACILITY): Payer: Medicare Other | Admitting: Internal Medicine

## 2014-01-24 DIAGNOSIS — R1311 Dysphagia, oral phase: Secondary | ICD-10-CM

## 2014-01-24 DIAGNOSIS — F331 Major depressive disorder, recurrent, moderate: Secondary | ICD-10-CM

## 2014-01-24 DIAGNOSIS — M069 Rheumatoid arthritis, unspecified: Secondary | ICD-10-CM

## 2014-01-24 DIAGNOSIS — I10 Essential (primary) hypertension: Secondary | ICD-10-CM

## 2014-01-24 DIAGNOSIS — E785 Hyperlipidemia, unspecified: Secondary | ICD-10-CM

## 2014-01-24 DIAGNOSIS — G894 Chronic pain syndrome: Secondary | ICD-10-CM | POA: Insufficient documentation

## 2014-01-24 NOTE — Assessment & Plan Note (Signed)
In 12/2013 LDL -130, HDL 49 which would be at goal;plan-cont omega 3 fatty acids

## 2014-01-24 NOTE — Assessment & Plan Note (Signed)
Stable and chronic but today pt looked more comfortable and relaxed than I have seen prior;plan- continue cymbalta 60 mg

## 2014-01-24 NOTE — Assessment & Plan Note (Signed)
Pt's BP and pulse were getting a little low so toprol XL was dec in 12/2013 from 50 mg to 25 mg

## 2014-01-24 NOTE — Assessment & Plan Note (Signed)
2/2 RA and OA;plan-continue with fentanyl patch, prn dilaudid, prn salonpas, and methotraxate

## 2014-01-24 NOTE — Progress Notes (Signed)
MRN: [id number] Name: Sheri Molina  Sex: female Age: 81 y.o. DOB: [birth year]  [facility] Facility/Room:218 Level Of Care: SNF Provider: Merrilee Seashore D Emergency Contacts: Extended Emergency Contact Information Primary Emergency Contact: Contact,No  [place] Home Phone: [phone] Relation: None  Code Status:DNR   Allergies: Ampicillin  Chief Complaint  Patient presents with  . Medical Management of Chronic Issues    HPI: Patient is 81 y.o. female who is being seen for routine issues.  Past Medical History  Diagnosis Date  . Localized superficial swelling, mass, or lump   . Paralysis agitans   . Urinary tract infection, site not specified   . Allergic rhinitis, cause unspecified   . Diaphragmatic hernia without mention of obstruction or gangrene   . 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 Chronic kidney disease, unspecified   . Unspecified constipation   . Myalgia and myositis, unspecified   . Peripheral vascular disease, unspecified   . Degeneration of lumbar or lumbosacral intervertebral disc   . Rheumatoid arthritis(714.0)   . Senile osteoporosis   . Generalized osteoarthrosis, involving multiple sites   . GERD (gastroesophageal reflux disease)   . Major depressive disorder, recurrent episode, moderate   . Hypertension   . Hyperlipidemia     History reviewed. No pertinent past surgical history.    Medication List       This list is accurate as of: 01/24/14  9:01 PM.  Always use your most recent med list.               APOKYN 10 MG/ML Soln  Generic drug:  APOMORPHINE HYDROCHLORIDE  Inject 0.2 mLs into the skin. up to 5 times a day prn increased parkinson's sx     ascorbic acid 1000 MG tablet  Commonly known as:  VITAMIN C  Take 1,000 mg by mouth daily.     calcium carbonate (dosed in mg elemental calcium) 1250 (500  CA) MG/5ML  Take 500 mg of elemental calcium by mouth.     Calcium Carbonate Antacid 648 MG Tabs  Take 1 tablet by mouth 2 (two) times daily.     carbidopa-levodopa-entacapone 50-200-200 MG per tablet  Commonly known as:  STALEVO  Take 1 tablet by mouth 3 (three) times daily.     DULoxetine 30 MG capsule  Commonly known as:  CYMBALTA  Take 30 mg by mouth daily.     fentaNYL 25 MCG/HR patch  Commonly known as:  DURAGESIC - dosed mcg/hr  Apply one patch topically every 72 hours. Remove old patch before placement. Rotate sites     FISH OIL BURP-LESS 1000 MG Caps  Take 1 capsule by mouth daily.     GENTEAL 0.3 % Soln  Generic drug:  Hypromellose  Apply 1 drop to eye every 12 (twelve) hours.     HYDROmorphone 4 MG tablet  Commonly known as:  DILAUDID  Take one tablet by mouth every 8 hours as needed for pain. One tablet per shift.     Melatonin 3 MG Tbdp  Take 1 tablet by mouth at bedtime.     metoprolol succinate 50 MG 24 hr tablet  Commonly known as:  TOPROL-XL  Take 50 mg by mouth daily. Take with or immediately following a meal.     MIRAPEX 1 MG tablet  Generic drug:  pramipexole  Take 2 mg by mouth 3 (three) times daily.  nitroGLYCERIN 0.4 MG SL tablet  Commonly known as:  NITROSTAT  Place 0.4 mg under the tongue every 5 (five) minutes as needed for chest pain.     polyethylene glycol packet  Commonly known as:  MIRALAX / GLYCOLAX  Take 17 g by mouth daily.     predniSONE 5 MG tablet  Commonly known as:  DELTASONE  Take 5 mg by mouth daily with breakfast.     SALONPAS 1.2-5.7-6.3 % Ptch  Generic drug:  Camphor-Menthol-Methyl Sal  Apply 1 patch topically daily. Remove after 12 hours     senna 8.6 MG tablet  Commonly known as:  SENOKOT  Take 2 tablets by mouth 2 (two) times daily.     trimethoprim 100 MG tablet  Commonly known as:  TRIMPEX  Take 100 mg by mouth daily.     Vitamin D (Ergocalciferol) 50000 UNITS Caps capsule  Commonly known as:  DRISDOL   Take 50,000 Units by mouth every 28 (twenty-eight) days.        No orders of the defined types were placed in this encounter.     There is no immunization history on file for this patient.  History  Substance Use Topics  . Smoking status: Unknown If Ever Smoked  . Smokeless tobacco: Not on file  . Alcohol Use: No    Review of Systems  DATA OBTAINED: from patient GENERAL:  no fevers, fatigue, appetite changes SKIN: No itching, rash HEENT: No complaint RESPIRATORY: No cough, wheezing, SOB CARDIAC: No chest pain, palpitations, lower extremity edema  GI: No abdominal pain, No N/V/D or constipation, No heartburn or reflux  GU: No dysuria, frequency or urgency, or incontinence  MUSCULOSKELETAL: No unrelieved bone/joint pain NEUROLOGIC: No headache, dizziness  PSYCHIATRIC: No overt anxiety or sadness  Filed Vitals:   01/24/14 1019  BP: 121/73  Pulse: 79  Temp: 97.4 F (36.3 C)  Resp: 18    Physical Exam  GENERAL APPEARANCE: Alert, conversant, No acute distress  SKIN: No diaphoresis rash HEENT: Unremarkable RESPIRATORY: Breathing is even, unlabored. Lung sounds are clear   CARDIOVASCULAR: Heart RRR no murmurs, rubs or gallops. No peripheral edema  GASTROINTESTINAL: Abdomen is soft, non-tender, not distended w/ normal bowel sounds.  GENITOURINARY: Bladder non tender, not distended  MUSCULOSKELETAL: No abnormal joints or musculature NEUROLOGIC: Cranial nerves 2-12 grossly intact. Moves all extremities PSYCHIATRIC: Mood and affect appropriate to situation, no behavioral issues  Patient Active Problem List   Diagnosis Date Noted  . Chronic pain syndrome 01/24/2014  . Dysphagia, oral phase 10/08/2013  . GERD (gastroesophageal reflux disease)   . Hypertension   . Hyperlipidemia   . Localized superficial swelling, mass, or lump   . Paralysis agitans   . Urinary tract infection, site not specified   . Allergic rhinitis, cause unspecified   . Major depressive  disorder, recurrent episode, moderate   . Diaphragmatic hernia without mention of obstruction or gangrene   . 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 Chronic kidney disease, stage 2, mildly decreased GFR   . Unspecified constipation   . Myalgia and myositis, unspecified   . Peripheral vascular disease, unspecified   . Degeneration of lumbar or lumbosacral intervertebral disc   . Rheumatoid arthritis involving multiple joints   . Senile osteoporosis   . Generalized osteoarthrosis, involving multiple sites     CBC   CMP    Assessment and Plan  Hypertension Pt's BP and pulse were getting a little low so toprol XL was dec in 12/2013 from 50 mg to 25 mg   Dysphagia, oral phase Chronic and stable without aspiration or weight loss;in addition pt has been working with restorative care   Major depressive disorder, recurrent episode, moderate Stable and chronic but today pt looked more comfortable and relaxed than I have seen prior;plan- continue cymbalta 60 mg   Rheumatoid arthritis involving multiple joints Pt has seen rheumatology.Plaquenil was d/c due to possible side ffects and methotrexate and folic acid were started.   Chronic pain syndrome 2/2 RA and OA;plan-continue with fentanyl patch, prn dilaudid, prn salonpas, and methotraxate     Margit Hanks, MD

## 2014-01-24 NOTE — Assessment & Plan Note (Signed)
Pt has seen rheumatology.Plaquenil was d/c due to possible side ffects and methotrexate and folic acid were started.

## 2014-01-24 NOTE — Assessment & Plan Note (Signed)
Chronic and stable without aspiration or weight loss;in addition pt has been working with restorative care

## 2014-02-04 ENCOUNTER — Emergency Department (HOSPITAL_COMMUNITY): Payer: Medicare Other

## 2014-02-04 ENCOUNTER — Encounter (HOSPITAL_COMMUNITY): Payer: Self-pay | Admitting: Family Medicine

## 2014-02-04 ENCOUNTER — Emergency Department (HOSPITAL_COMMUNITY)
Admission: EM | Admit: 2014-02-04 | Discharge: 2014-02-04 | Disposition: A | Discharge status: Home / Self Care | Payer: Medicare Other | Attending: Emergency Medicine | Admitting: Emergency Medicine

## 2014-02-04 DIAGNOSIS — Z79891 Long term (current) use of opiate analgesic: Secondary | ICD-10-CM | POA: Diagnosis not present

## 2014-02-04 DIAGNOSIS — K219 Gastro-esophageal reflux disease without esophagitis: Secondary | ICD-10-CM | POA: Diagnosis not present

## 2014-02-04 DIAGNOSIS — G20 Parkinson's disease: Secondary | ICD-10-CM | POA: Diagnosis not present

## 2014-02-04 DIAGNOSIS — Z8639 Personal history of other endocrine, nutritional and metabolic disease: Secondary | ICD-10-CM | POA: Diagnosis not present

## 2014-02-04 DIAGNOSIS — N189 Chronic kidney disease, unspecified: Secondary | ICD-10-CM | POA: Insufficient documentation

## 2014-02-04 DIAGNOSIS — Z87440 Personal history of urinary (tract) infections: Secondary | ICD-10-CM | POA: Diagnosis not present

## 2014-02-04 DIAGNOSIS — Z792 Long term (current) use of antibiotics: Secondary | ICD-10-CM | POA: Diagnosis not present

## 2014-02-04 DIAGNOSIS — G8929 Other chronic pain: Secondary | ICD-10-CM | POA: Diagnosis not present

## 2014-02-04 DIAGNOSIS — R079 Chest pain, unspecified: Secondary | ICD-10-CM | POA: Insufficient documentation

## 2014-02-04 DIAGNOSIS — M810 Age-related osteoporosis without current pathological fracture: Secondary | ICD-10-CM | POA: Insufficient documentation

## 2014-02-04 DIAGNOSIS — Z79899 Other long term (current) drug therapy: Secondary | ICD-10-CM | POA: Insufficient documentation

## 2014-02-04 DIAGNOSIS — R05 Cough: Secondary | ICD-10-CM | POA: Diagnosis not present

## 2014-02-04 DIAGNOSIS — Z8659 Personal history of other mental and behavioral disorders: Secondary | ICD-10-CM | POA: Insufficient documentation

## 2014-02-04 DIAGNOSIS — Z7952 Long term (current) use of systemic steroids: Secondary | ICD-10-CM | POA: Insufficient documentation

## 2014-02-04 DIAGNOSIS — I129 Hypertensive chronic kidney disease with stage 1 through stage 4 chronic kidney disease, or unspecified chronic kidney disease: Secondary | ICD-10-CM | POA: Insufficient documentation

## 2014-02-04 HISTORY — DX: Parkinson's disease without dyskinesia, without mention of fluctuations: G20.A1

## 2014-02-04 HISTORY — DX: Parkinson's disease: G20

## 2014-02-04 LAB — COMPREHENSIVE METABOLIC PANEL
ALK PHOS: 47 U/L (ref 39–117)
ALT: 6 U/L (ref 0–35)
AST: 22 U/L (ref 0–37)
Albumin: 3.4 g/dL — ABNORMAL LOW (ref 3.5–5.2)
Anion gap: 5 (ref 5–15)
BUN: 12 mg/dL (ref 6–23)
CALCIUM: 8.7 mg/dL (ref 8.4–10.5)
CO2: 27 mmol/L (ref 19–32)
Chloride: 108 mmol/L (ref 96–112)
Creatinine, Ser: 0.7 mg/dL (ref 0.50–1.10)
GFR calc Af Amer: >90 mL/min (ref >90)
GFR calc non Af Amer: 79 mL/min — ABNORMAL LOW (ref >90)
GLUCOSE: 108 mg/dL — AB (ref 70–99)
POTASSIUM: 4 mmol/L (ref 3.5–5.1)
Sodium: 140 mmol/L (ref 135–145)
Total Bilirubin: 0.5 mg/dL (ref 0.3–1.2)
Total Protein: 5.9 g/dL — ABNORMAL LOW (ref 6.0–8.3)

## 2014-02-04 LAB — CBC
HEMATOCRIT: 35.3 % — AB (ref 36.0–46.0)
HEMOGLOBIN: 11.9 g/dL — AB (ref 12.0–15.0)
MCH: 31.3 pg (ref 26.0–34.0)
MCHC: 33.7 g/dL (ref 30.0–36.0)
MCV: 92.9 fL (ref 78.0–100.0)
Platelets: 231 10*3/uL (ref 150–400)
RBC: 3.8 MIL/uL — AB (ref 3.87–5.11)
RDW: 13.2 % (ref 11.5–15.5)
WBC: 6 10*3/uL (ref 4.0–10.5)

## 2014-02-04 LAB — TROPONIN I

## 2014-02-04 MED ORDER — GI COCKTAIL ~~LOC~~
30.0000 mL | Freq: Once | ORAL | Status: AC
Start: 2014-02-04 — End: 2014-02-04
  Administered 2014-02-04: 30 mL via ORAL
  Filled 2014-02-04: qty 30

## 2014-02-04 MED ORDER — FAMOTIDINE 20 MG PO TABS
20.0000 mg | ORAL_TABLET | Freq: Once | ORAL | Status: AC
  Administered 2014-02-04: 20 mg via ORAL
  Filled 2014-02-04: qty 1

## 2014-02-04 NOTE — Discharge Instructions (Signed)
It was our pleasure to provide your ER care today - we hope that you feel better.  Follow up with your primary care doctor for recheck in the next few days.  For recent chest pain, follow up with cardiologist in 1 weeks time - see referral - call their office Monday to arrange follow up with them.  Return to ER if worse, new symptoms, recurrent or persistent chest pain, trouble breathing, fevers, other concern.     Chest Pain (Nonspecific) It is often hard to give a specific diagnosis for the cause of chest pain. There is always a chance that your pain could be related to something serious, such as a heart attack or a blood clot in the lungs. You need to follow up with your health care provider for further evaluation. CAUSES   Heartburn.  Pneumonia or bronchitis.  Anxiety or stress.  Inflammation around your heart (pericarditis) or lung (pleuritis or pleurisy).  A blood clot in the lung.  A collapsed lung (pneumothorax). It can develop suddenly on its own (spontaneous pneumothorax) or from trauma to the chest.  Shingles infection (herpes zoster virus). The chest wall is composed of bones, muscles, and cartilage. Any of these can be the source of the pain.  The bones can be bruised by injury.  The muscles or cartilage can be strained by coughing or overwork.  The cartilage can be affected by inflammation and become sore (costochondritis). DIAGNOSIS  Lab tests or other studies may be needed to find the cause of your pain. Your health care provider may have you take a test called an ambulatory electrocardiogram (ECG). An ECG records your heartbeat patterns over a 24-hour period. You may also have other tests, such as:  Transthoracic echocardiogram (TTE). During echocardiography, sound waves are used to evaluate how blood flows through your heart.  Transesophageal echocardiogram (TEE).  Cardiac monitoring. This allows your health care provider to monitor your heart rate and  rhythm in real time.  Holter monitor. This is a portable device that records your heartbeat and can help diagnose heart arrhythmias. It allows your health care provider to track your heart activity for several days, if needed.  Stress tests by exercise or by giving medicine that makes the heart beat faster. TREATMENT   Treatment depends on what may be causing your chest pain. Treatment may include:  Acid blockers for heartburn.  Anti-inflammatory medicine.  Pain medicine for inflammatory conditions.  Antibiotics if an infection is present.  You may be advised to change lifestyle habits. This includes stopping smoking and avoiding alcohol, caffeine, and chocolate.  You may be advised to keep your head raised (elevated) when sleeping. This reduces the chance of acid going backward from your stomach into your esophagus. Most of the time, nonspecific chest pain will improve within 2-3 days with rest and mild pain medicine.  HOME CARE INSTRUCTIONS   If antibiotics were prescribed, take them as directed. Finish them even if you start to feel better.  For the next few days, avoid physical activities that bring on chest pain. Continue physical activities as directed.  Do not use any tobacco products, including cigarettes, chewing tobacco, or electronic cigarettes.  Avoid drinking alcohol.  Only take medicine as directed by your health care provider.  Follow your health care provider's suggestions for further testing if your chest pain does not go away.  Keep any follow-up appointments you made. If you do not go to an appointment, you could develop lasting (chronic) problems with pain.  If there is any problem keeping an appointment, call to reschedule. SEEK MEDICAL CARE IF:   Your chest pain does not go away, even after treatment.  You have a rash with blisters on your chest.  You have a fever. SEEK IMMEDIATE MEDICAL CARE IF:   You have increased chest pain or pain that spreads to  your arm, neck, jaw, back, or abdomen.  You have shortness of breath.  You have an increasing cough, or you cough up blood.  You have severe back or abdominal pain.  You feel nauseous or vomit.  You have severe weakness.  You faint.  You have chills. This is an emergency. Do not wait to see if the pain will go away. Get medical help at once. Call your local emergency services (911 in [place] Do not drive yourself to the hospital. MAKE SURE YOU:   Understand these instructions.  Will watch your condition.  Will get help right away if you are not doing well or get worse. Document Released: 10/01/2004 Document Revised: 12/27/2012 Document Reviewed: 07/28/2007 [facility] Patient Information 2015 [place], [place]. This information is not intended to replace advice given to you by your health care provider. Make sure you discuss any questions you have with your health care provider.  Chest Pain (Nonspecific) It is often hard to give a specific diagnosis for the cause of chest pain. There is always a chance that your pain could be related to something serious, such as a heart attack or a blood clot in the lungs. You need to follow up with your health care provider for further evaluation. CAUSES   Heartburn.  Pneumonia or bronchitis.  Anxiety or stress.  Inflammation around your heart (pericarditis) or lung (pleuritis or pleurisy).  A blood clot in the lung.  A collapsed lung (pneumothorax). It can develop suddenly on its own (spontaneous pneumothorax) or from trauma to the chest.  Shingles infection (herpes zoster virus). The chest wall is composed of bones, muscles, and cartilage. Any of these can be the source of the pain.  The bones can be bruised by injury.  The muscles or cartilage can be strained by coughing or overwork.  The cartilage can be affected by inflammation and become sore (costochondritis). DIAGNOSIS  Lab tests or other studies may be needed to find the cause  of your pain. Your health care provider may have you take a test called an ambulatory electrocardiogram (ECG). An ECG records your heartbeat patterns over a 24-hour period. You may also have other tests, such as:  Transthoracic echocardiogram (TTE). During echocardiography, sound waves are used to evaluate how blood flows through your heart.  Transesophageal echocardiogram (TEE).  Cardiac monitoring. This allows your health care provider to monitor your heart rate and rhythm in real time.  Holter monitor. This is a portable device that records your heartbeat and can help diagnose heart arrhythmias. It allows your health care provider to track your heart activity for several days, if needed.  Stress tests by exercise or by giving medicine that makes the heart beat faster. TREATMENT   Treatment depends on what may be causing your chest pain. Treatment may include:  Acid blockers for heartburn.  Anti-inflammatory medicine.  Pain medicine for inflammatory conditions.  Antibiotics if an infection is present.  You may be advised to change lifestyle habits. This includes stopping smoking and avoiding alcohol, caffeine, and chocolate.  You may be advised to keep your head raised (elevated) when sleeping. This reduces the chance of acid going  backward from your stomach into your esophagus. Most of the time, nonspecific chest pain will improve within 2-3 days with rest and mild pain medicine.  HOME CARE INSTRUCTIONS   If antibiotics were prescribed, take them as directed. Finish them even if you start to feel better.  For the next few days, avoid physical activities that bring on chest pain. Continue physical activities as directed.  Do not use any tobacco products, including cigarettes, chewing tobacco, or electronic cigarettes.  Avoid drinking alcohol.  Only take medicine as directed by your health care provider.  Follow your health care provider's suggestions for further testing if  your chest pain does not go away.  Keep any follow-up appointments you made. If you do not go to an appointment, you could develop lasting (chronic) problems with pain. If there is any problem keeping an appointment, call to reschedule. SEEK MEDICAL CARE IF:   Your chest pain does not go away, even after treatment.  You have a rash with blisters on your chest.  You have a fever. SEEK IMMEDIATE MEDICAL CARE IF:   You have increased chest pain or pain that spreads to your arm, neck, jaw, back, or abdomen.  You have shortness of breath.  You have an increasing cough, or you cough up blood.  You have severe back or abdominal pain.  You feel nauseous or vomit.  You have severe weakness.  You faint.  You have chills. This is an emergency. Do not wait to see if the pain will go away. Get medical help at once. Call your local emergency services (911 in [place] Do not drive yourself to the hospital. MAKE SURE YOU:   Understand these instructions.  Will watch your condition.  Will get help right away if you are not doing well or get worse. Document Released: 10/01/2004 Document Revised: 12/27/2012 Document Reviewed: 07/28/2007 [facility] Patient Information 2015 [place], [place]. This information is not intended to replace advice given to you by your health care provider. Make sure you discuss any questions you have with your health care provider.

## 2014-02-04 NOTE — ED Notes (Signed)
Pt transferred to Pod C to await PTAR, [place] RN received patient.

## 2014-02-04 NOTE — ED Notes (Addendum)
Pt presents from [facility] via [facility] with c/o chest pressure that began today at 0800 today.  Per pt she has had intermittent chest pressure over the last few weeks but she states this morning the pain was more intense and was accompanied by nausea and lightheadedness. Pt was given 2SL Nitro by [employer] and 325mg  ASA by EMS PTA.  Pt is A&Ox4 and in NAD. Pt with hx Parkinson's and has a fentanyl patch on left scapula. Patient reports her blood pressure is typically 80's/50's.

## 2014-02-04 NOTE — ED Provider Notes (Addendum)
CSN: [id number]     Arrival date & time 02/04/14  0944 History   First MD Initiated Contact with Patient 02/04/14 [phone]     Chief Complaint  Patient presents with  . Chest Pain     (Consider location/radiation/quality/duration/timing/severity/associated sxs/prior Treatment) Patient is a 81 y.o. female presenting with chest pain. The history is provided by the patient.  Chest Pain Associated symptoms: cough   Associated symptoms: no abdominal pain, no back pain, no fever, no headache, no nausea, no palpitations, no shortness of breath and not vomiting   pt with hx Parkinsons, chronic pain due to RA, gerd, c/o midline, lower sternal area chest pain in the past few days. States pain today constant, dull, moderate, non radiating. No specific exacerbating or alleviating factors. Occurs at rest. States initially felt like heartburn or indigestion that she has had in past, but as was persistent, wanted to get checked. No chest wall injury or strain. Occasional non prod cough. No sore throat, runny nose, or other uri c/o. No fever or chills. No pleuritic pain. No leg pain or swelling. Pt denies any unusual doe or fatigue. No nv. No diaphoresis. Pt denies personal or fam hx cad.   Pt indicates her normal bp is in range 90/50.  Denies faintness/dizziness.   Pt w MAR, and paperwork from [facility], including DNR form.      Past Medical History  Diagnosis Date  . Localized superficial swelling, mass, or lump   . Paralysis agitans   . Urinary tract infection, site not specified   . Allergic rhinitis, cause unspecified   . Diaphragmatic hernia without mention of obstruction or gangrene   . 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 Chronic kidney disease, unspecified   . Unspecified constipation   . Myalgia and myositis, unspecified   . Peripheral vascular disease, unspecified   . Senile osteoporosis    . GERD (gastroesophageal reflux disease)   . Major depressive disorder, recurrent episode, moderate   . Hypertension   . Hyperlipidemia   . Parkinson disease   . Degeneration of lumbar or lumbosacral intervertebral disc   . Rheumatoid arthritis(714.0)   . Generalized osteoarthrosis, involving multiple sites    History reviewed. No pertinent past surgical history. No family history on file. History  Substance Use Topics  . Smoking status: Never Smoker   . Smokeless tobacco: Not on file  . Alcohol Use: No   OB History    No data available     Review of Systems  Constitutional: Negative for fever and chills.  HENT: Negative for sore throat.   Eyes: Negative for redness.  Respiratory: Positive for cough. Negative for shortness of breath.   Cardiovascular: Positive for chest pain. Negative for palpitations and leg swelling.  Gastrointestinal: Negative for nausea, vomiting and abdominal pain.  Genitourinary: Negative for flank pain.  Musculoskeletal: Negative for back pain and neck pain.  Skin: Negative for rash.  Neurological: Negative for headaches.  Hematological: Does not bruise/bleed easily.  Psychiatric/Behavioral: Negative for confusion.      Allergies  Ampicillin  Home Medications   Prior to Admission medications   Medication Sig Start Date End Date Taking? Authorizing Provider  APOMORPHINE HYDROCHLORIDE (APOKYN) 10 MG/ML SOLN Inject 0.2 mLs into the skin. up to 5 times a day prn increased parkinson's sx    Historical Provider, MD  ascorbic acid (VITAMIN C) 1000 MG tablet Take 1,000  mg by mouth daily.    Historical Provider, MD  Calcium Carbonate Antacid 648 MG TABS Take 1 tablet by mouth 2 (two) times daily.    Historical Provider, MD  calcium carbonate, dosed in mg elemental calcium, 1250 MG/5ML Take 500 mg of elemental calcium by mouth.    Historical Provider, MD  Camphor-Menthol-Methyl Sal (SALONPAS) 1.2-5.7-6.3 % PTCH Apply 1 patch topically daily. Remove  after 12 hours    Historical Provider, MD  carbidopa-levodopa-entacapone (STALEVO) 50-200-200 MG per tablet Take 1 tablet by mouth 3 (three) times daily.    Historical Provider, MD  DULoxetine (CYMBALTA) 30 MG capsule Take 30 mg by mouth daily.    Historical Provider, MD  fentaNYL (DURAGESIC - DOSED MCG/HR) 25 MCG/HR patch Apply one patch topically every 72 hours. Remove old patch before placement. Rotate sites 12/11/13   Tiffany L Reed, DO  HYDROmorphone (DILAUDID) 4 MG tablet Take one tablet by mouth every 8 hours as needed for pain. One tablet per shift. 08/01/13   Mahima Glade Lloyd, MD  Hypromellose (GENTEAL) 0.3 % SOLN Apply 1 drop to eye every 12 (twelve) hours.    Historical Provider, MD  Melatonin 3 MG TBDP Take 1 tablet by mouth at bedtime.    Historical Provider, MD  metoprolol succinate (TOPROL-XL) 50 MG 24 hr tablet Take 50 mg by mouth daily. Take with or immediately following a meal.    Historical Provider, MD  nitroGLYCERIN (NITROSTAT) 0.4 MG SL tablet Place 0.4 mg under the tongue every 5 (five) minutes as needed for chest pain.    Historical Provider, MD  Omega-3 Fatty Acids (FISH OIL BURP-LESS) 1000 MG CAPS Take 1 capsule by mouth daily.    Historical Provider, MD  polyethylene glycol (MIRALAX / GLYCOLAX) packet Take 17 g by mouth daily.    Historical Provider, MD  pramipexole (MIRAPEX) 1 MG tablet Take 2 mg by mouth 3 (three) times daily.    Historical Provider, MD  predniSONE (DELTASONE) 5 MG tablet Take 5 mg by mouth daily with breakfast.    Historical Provider, MD  senna (SENOKOT) 8.6 MG tablet Take 2 tablets by mouth 2 (two) times daily.    Historical Provider, MD  trimethoprim (TRIMPEX) 100 MG tablet Take 100 mg by mouth daily.    Historical Provider, MD  Vitamin D, Ergocalciferol, (DRISDOL) 50000 UNITS CAPS capsule Take 50,000 Units by mouth every 28 (twenty-eight) days.    Historical Provider, MD   BP 85/43 mmHg  Pulse 71  Temp(Src) 98.1 F (36.7 C) (Oral)  Resp 18  Ht 5\' 5"   (1.651 m)  Wt 123 lb (55.792 kg)  BMI 20.47 kg/m2  SpO2 93% Physical Exam  Constitutional: She is oriented to person, place, and time. She appears well-developed and well-nourished. No distress.  HENT:  Mouth/Throat: Oropharynx is clear and moist.  Eyes: Conjunctivae are normal. No scleral icterus.  Neck: Neck supple. No tracheal deviation present.  Cardiovascular: Normal rate, regular rhythm, normal heart sounds and intact distal pulses.  Exam reveals no gallop and no friction rub.   No murmur heard. Pulmonary/Chest: Effort normal and breath sounds normal. No respiratory distress. She exhibits no tenderness.  Abdominal: Soft. Normal appearance and bowel sounds are normal. She exhibits no distension. There is no tenderness.  Genitourinary:  No cva tenderness  Musculoskeletal: She exhibits no edema or tenderness.  Neurological: She is alert and oriented to person, place, and time.  Skin: Skin is warm and dry. No rash noted.  Psychiatric: She has a normal  mood and affect.  Nursing note and vitals reviewed.   ED Course  Procedures (including critical care time) Labs Review  Results for orders placed or performed during the hospital encounter of 02/04/14  CBC  Result Value Ref Range   WBC 6.0 4.0 - 10.5 K/uL   RBC 3.80 (L) 3.87 - 5.11 MIL/uL   Hemoglobin 11.9 (L) 12.0 - 15.0 g/dL   HCT 02.4 (L) 09.7 - 35.3 %   MCV 92.9 78.0 - 100.0 fL   MCH 31.3 26.0 - 34.0 pg   MCHC 33.7 30.0 - 36.0 g/dL   RDW 29.9 24.2 - 68.3 %   Platelets 231 150 - 400 K/uL  Comprehensive metabolic panel  Result Value Ref Range   Sodium 140 135 - 145 mmol/L   Potassium 4.0 3.5 - 5.1 mmol/L   Chloride 108 96 - 112 mmol/L   CO2 27 19 - 32 mmol/L   Glucose, Bld 108 (H) 70 - 99 mg/dL   BUN 12 6 - 23 mg/dL   Creatinine, Ser 4.19 0.50 - 1.10 mg/dL   Calcium 8.7 8.4 - 62.2 mg/dL   Total Protein 5.9 (L) 6.0 - 8.3 g/dL   Albumin 3.4 (L) 3.5 - 5.2 g/dL   AST 22 0 - 37 U/L   ALT 6 0 - 35 U/L   Alkaline  Phosphatase 47 39 - 117 U/L   Total Bilirubin 0.5 0.3 - 1.2 mg/dL   GFR calc non Af Amer 79 (L) >90 mL/min   GFR calc Af Amer >90 >90 mL/min   Anion gap 5 5 - 15  Troponin I  Result Value Ref Range   Troponin I <0.03 <0.031 ng/mL   Dg Chest 2 View  02/04/2014   CLINICAL DATA:  Acute chest pain.  EXAM: CHEST  2 VIEW  COMPARISON:  October 17, 2007.  FINDINGS: The heart size and mediastinal contours are within normal limits. No pneumothorax or pleural effusion is noted. Old left rib fractures are noted. No acute pulmonary disease is noted. Chronic degenerative changes are seen involving both shoulders.  IMPRESSION: No acute cardiopulmonary abnormality seen.   Electronically Signed   By: Roque Lias M.D.   On: 02/04/2014 11:36       EKG Interpretation   Date/Time:  Sunday February 04 2014 09:45:49 EST Ventricular Rate:  75 PR Interval:  180 QRS Duration: 80 QT Interval:  398 QTC Calculation: 444 R Axis:   -19 Text Interpretation:  Sinus rhythm Nonspecific T wave abnormality No  previous tracing Confirmed by Denton Lank  MD, Caryn Bee ([id number]) on 02/04/2014  9:51:17 AM      MDM   Iv ns. Labs. Ecg. Cxr.  Reviewed nursing notes and prior charts for additional history.   pepcid and gi cocktail given for symptom relief.  Recheck no cp. No sob.   Pt reaffirms her normal/baseline bp is in 90/50 range.  Currently bp 98/50 - no faintness or dizziness.  Pt remains cp free on recheck.  After symptoms of couple days duration, trop neg.  Pt remains symptom free and currently appears stable for d/c.  Return precautions discussed.       Suzi Roots, MD 02/04/14 1318

## 2014-02-04 NOTE — ED Notes (Signed)
Pt returned from X-ray.  

## 2014-02-04 NOTE — ED Notes (Signed)
Pt and [facility] staff comfortable with discharge and follow up instructions. PTAR Paged.

## 2014-02-04 NOTE — ED Notes (Signed)
Patient transported to X-ray 

## 2014-02-04 NOTE — ED Notes (Signed)
Phlebotomy at bedside.

## 2014-02-26 ENCOUNTER — Encounter: Payer: Self-pay | Admitting: Internal Medicine

## 2014-02-26 ENCOUNTER — Non-Acute Institutional Stay (SKILLED_NURSING_FACILITY): Payer: Medicare Other | Admitting: Internal Medicine

## 2014-02-26 DIAGNOSIS — K219 Gastro-esophageal reflux disease without esophagitis: Secondary | ICD-10-CM

## 2014-02-26 DIAGNOSIS — M159 Polyosteoarthritis, unspecified: Secondary | ICD-10-CM

## 2014-02-26 DIAGNOSIS — G20 Parkinson's disease: Secondary | ICD-10-CM

## 2014-02-26 DIAGNOSIS — G629 Polyneuropathy, unspecified: Secondary | ICD-10-CM

## 2014-02-26 DIAGNOSIS — M810 Age-related osteoporosis without current pathological fracture: Secondary | ICD-10-CM

## 2014-02-26 NOTE — Assessment & Plan Note (Addendum)
C/o burning pain in stomach (points to epigastric area) for several weeks. According to chart pt was put on prednisone 40 mg daily on 2/18 until 2/24 then back to 5 mg daily. Pt is on protonix BID currently. After high dose prednisone is finished hope control is achieved

## 2014-02-26 NOTE — Assessment & Plan Note (Signed)
Chronic and progressive, pt stated today some difficulty with swallowing which is not unexpected by her. No change in medications, pt asked for apokyn as she needs it

## 2014-02-26 NOTE — Assessment & Plan Note (Signed)
Due to OA and RA; so far successfully treated with cymbalta

## 2014-02-26 NOTE — Assessment & Plan Note (Signed)
Chronic and stable with no recent falls or fractures; pain meds for this and as it relates to Parkinsons

## 2014-02-26 NOTE — Assessment & Plan Note (Signed)
And at risk for fall/fractures;continue calcium and Vit D

## 2014-02-26 NOTE — Progress Notes (Signed)
MRN: [id number] Name: Sheri Molina  Sex: female Age: 81 y.o. DOB: May 05, 1929  PSC #: [facility] Facility/Room:218 Level Of Care: SNF Provider: Merrilee Seashore D Emergency Contacts: Extended Emergency Contact Information Primary Emergency Contact: Contact,No  [place] Home Phone: [phone] Relation: None  Code Status: DNR  Allergies: Ampicillin  Chief Complaint  Sheri Molina presents with  . Medical Management of Chronic Issues    HPI: Sheri Molina is 81 y.o. female who is being seen for routine issues.  Past Medical History  Diagnosis Date  . Localized superficial swelling, mass, or lump   . Paralysis agitans   . Urinary tract infection, site not specified   . Allergic rhinitis, cause unspecified   . Diaphragmatic hernia without mention of obstruction or gangrene   . 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 Chronic kidney disease, unspecified   . Unspecified constipation   . Myalgia and myositis, unspecified   . Peripheral vascular disease, unspecified   . Senile osteoporosis   . GERD (gastroesophageal reflux disease)   . Major depressive disorder, recurrent episode, moderate   . Hypertension   . Hyperlipidemia   . Parkinson disease   . Degeneration of lumbar or lumbosacral intervertebral disc   . Rheumatoid arthritis(714.0)   . Generalized osteoarthrosis, involving multiple sites     History reviewed. No pertinent past surgical history.    Medication List       This list is accurate as of: 02/26/14  8:38 PM.  Always use your most recent med list.               alum & mag hydroxide-simeth 200-200-20 MG/5ML suspension  Commonly known as:  MAALOX/MYLANTA  Take 30 mLs by mouth every 6 (six) hours as needed for indigestion or heartburn.     APOKYN 10 MG/ML Soln  Generic drug:  APOMORPHINE HYDROCHLORIDE  Inject 0.2 mLs into the skin. up to 5  times a day prn increased parkinson's sx     carbidopa-levodopa-entacapone 50-200-200 MG per tablet  Commonly known as:  STALEVO  Take 1 tablet by mouth 3 (three) times daily.     carboxymethylcellulose 0.5 % Soln  Commonly known as:  REFRESH PLUS  Apply 1 drop to eye 3 (three) times daily as needed (dry eyes).     DULoxetine 60 MG capsule  Commonly known as:  CYMBALTA  Take 60 mg by mouth daily.     fentaNYL 25 MCG/HR patch  Commonly known as:  DURAGESIC - dosed mcg/hr  Apply one patch topically every 72 hours. Remove old patch before placement. Rotate sites     FISH OIL BURP-LESS 1000 MG Caps  Take 1 capsule by mouth daily.     folic acid 1 MG tablet  Commonly known as:  FOLVITE  Take 1 mg by mouth daily.     HYDROmorphone 4 MG tablet  Commonly known as:  DILAUDID  Take one tablet by mouth every 8 hours as needed for pain. One tablet per shift.     hydroxychloroquine 200 MG tablet  Commonly known as:  PLAQUENIL  Take 200 mg by mouth 2 (two) times daily.     metoprolol succinate 25 MG 24 hr tablet  Commonly known as:  TOPROL-XL  Take 25 mg by mouth daily.     MIRAPEX 1 MG tablet  Generic drug:  pramipexole  Take 2 mg by mouth 3 (three) times daily.  nitroGLYCERIN 0.4 MG SL tablet  Commonly known as:  NITROSTAT  Place 0.4 mg under the tongue every 5 (five) minutes as needed for chest pain.     NON FORMULARY  Take 120 mLs by mouth 2 (two) times daily. Med Pass     NON FORMULARY  Take 1 tablet by mouth 3 (three) times daily. Move Free Advanced   500/67/500mg      Oyster Shell 500 MG Tabs  Take 500 mg by mouth 2 (two) times daily.     pantoprazole 40 MG tablet  Commonly known as:  PROTONIX  Take 40 mg by mouth daily.     polyethylene glycol packet  Commonly known as:  MIRALAX / GLYCOLAX  Take 17 g by mouth daily.     predniSONE 5 MG tablet  Commonly known as:  DELTASONE  Take 5 mg by mouth daily with breakfast.     Propylene Glycol 0.6 % Soln  Apply 1  drop to eye 3 (three) times daily.     SALONPAS 1.2-5.7-6.3 % Ptch  Generic drug:  Camphor-Menthol-Methyl Sal  Apply 1 patch topically daily. Remove after 12 hours     sennosides-docusate sodium 8.6-50 MG tablet  Commonly known as:  SENOKOT-S  Take 1 tablet by mouth 2 (two) times daily.     UTI-STAT PO  Take 30 mLs by mouth 2 (two) times daily.     Vitamin D (Ergocalciferol) 50000 UNITS Caps capsule  Commonly known as:  DRISDOL  Take 50,000 Units by mouth every 28 (twenty-eight) days.        No orders of the defined types were placed in this encounter.     There is no immunization history on file for this Sheri Molina.  History  Substance Use Topics  . Smoking status: Never Smoker   . Smokeless tobacco: Not on file  . Alcohol Use: No    Review of Systems  DATA OBTAINED: from Sheri Molina GENERAL:  no fevers, fatigue SKIN: No itching, rash HEENT: No complaint RESPIRATORY: No cough, wheezing, SOB CARDIAC: No chest pain, palpitations, lower extremity edema  GI: epigastric pain, No N/V/D or constipation, + heartburn, no reflux  GU: No dysuria, frequency or urgency, or incontinence  MUSCULOSKELETAL: No unrelieved bone/joint pain NEUROLOGIC: No headache, dizziness  PSYCHIATRIC: No overt anxiety or sadness  Filed Vitals:   02/26/14 1409  BP: 110/60  Pulse: 68  Temp: 98.4 F (36.9 C)  Resp: 20    Physical Exam  GENERAL APPEARANCE: Alert, conversant, No acute distress  SKIN: No diaphoresis rash HEENT: Unremarkable RESPIRATORY: Breathing is even, unlabored. Lung sounds are clear   CARDIOVASCULAR: Heart RRR no murmurs, rubs or gallops. No peripheral edema  GASTROINTESTINAL: Abdomen is soft, + epigastric TTP, no guard or rebound, not distended w/ normal bowel sounds.  GENITOURINARY: Bladder non tender, not distended  MUSCULOSKELETAL: OA and RA changes NEUROLOGIC: Cranial nerves 2-12 grossly intact. PSYCHIATRIC: Mood and affect appropriate to situation, no behavioral  issues  Sheri Molina Active Problem List   Diagnosis Date Noted  . Peripheral neuropathy 02/26/2014  . Chronic pain syndrome 01/24/2014  . Dysphagia, oral phase 10/08/2013  . GERD (gastroesophageal reflux disease)   . Hypertension   . Hyperlipidemia   . Localized superficial swelling, mass, or lump   . Paralysis agitans   . Urinary tract infection, site not specified   . Allergic rhinitis, cause unspecified   . Major depressive disorder, recurrent episode, moderate   . Diaphragmatic hernia without mention of obstruction or gangrene   .  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 Chronic kidney disease, stage 2, mildly decreased GFR   . Unspecified constipation   . Myalgia and myositis, unspecified   . Peripheral vascular disease, unspecified   . Degeneration of lumbar or lumbosacral intervertebral disc   . Rheumatoid arthritis involving multiple joints   . Senile osteoporosis   . Generalized osteoarthrosis, involving multiple sites     CBC    Component Value Date/Time   WBC 6.0 02/04/2014 1131   RBC 3.80* 02/04/2014 1131   HGB 11.9* 02/04/2014 1131   HCT 35.3* 02/04/2014 1131   PLT 231 02/04/2014 1131   MCV 92.9 02/04/2014 1131    CMP     Component Value Date/Time   NA 140 02/04/2014 1131   K 4.0 02/04/2014 1131   CL 108 02/04/2014 1131   CO2 27 02/04/2014 1131   GLUCOSE 108* 02/04/2014 1131   BUN 12 02/04/2014 1131   CREATININE 0.70 02/04/2014 1131   CALCIUM 8.7 02/04/2014 1131   PROT 5.9* 02/04/2014 1131   ALBUMIN 3.4* 02/04/2014 1131   AST 22 02/04/2014 1131   ALT 6 02/04/2014 1131   ALKPHOS 47 02/04/2014 1131   BILITOT 0.5 02/04/2014 1131   GFRNONAA 79* 02/04/2014 1131   GFRAA >90 02/04/2014 1131    Assessment and Plan  GERD (gastroesophageal reflux disease) C/o burning pain in stomach (points to epigastric area) for several weeks. According to  chart pt was put on prednisone 40 mg daily on 2/18 until 2/24 then back to 5 mg daily. Pt is on protonix BID currently. After high dose prednisone is finished hope control is achieved    Paralysis agitans Chronic and progressive, pt stated today some difficulty with swallowing which is not unexpected by her. No change in medications, pt asked for apokyn as she needs it   Generalized osteoarthrosis, involving multiple sites Chronic and stable with no recent falls or fractures; pain meds for this and as it relates to Parkinsons   Senile osteoporosis And at risk for fall/fractures;continue calcium and Vit D   Peripheral neuropathy Due to OA and RA; so far successfully treated with cymbalta     Margit Hanks, MD

## 2014-03-06 ENCOUNTER — Encounter: Payer: Self-pay | Admitting: Cardiovascular Disease

## 2014-03-06 ENCOUNTER — Ambulatory Visit (INDEPENDENT_AMBULATORY_CARE_PROVIDER_SITE_OTHER): Payer: Medicare Other | Admitting: Cardiovascular Disease

## 2014-03-06 VITALS — BP 90/62 | HR 81 | Ht 65.0 in

## 2014-03-06 DIAGNOSIS — R072 Precordial pain: Secondary | ICD-10-CM

## 2014-03-06 NOTE — Progress Notes (Signed)
Patient ID: Sheri Molina, female   DOB: 1929-06-24, 81 y.o.   MRN: [id number]     Cardiology Office Note   Date:  03/06/2014   ID:  Sheri Molina, DOB August 28, 1929, MRN [id number]  PCP:  Margit Hanks, MD  Cardiologist:   Charlton Haws, MD   No chief complaint on file.     History of Present Illness: Sheri Molina is a 81 y.o. female who presents for evaluation of chest pain  Seen in ER 02/04/14  Reviewed records  She is very debilitated with left sided chronic paralysis  She is a DNR She indicates having abdominal pain not chest pain.  Some relation to food At assisted living they cycle the same food weekly and she doesn't like this.  Now on miralax and protonic  No nausea or vomiting.  No history of CAD  She is in the wheelchair all the time  No chest pain since ER  Compliant with meds.  Has a daughter and son that look after her   Chest Pain Associated symptoms: cough  Associated symptoms: no abdominal pain, no back pain, no fever, no headache, no nausea, no palpitations, no shortness of breath and not vomiting  pt with hx Parkinsons, chronic pain due to RA, gerd, c/o midline, lower sternal area chest pain in the past few days. States pain today constant, dull, moderate, non radiating. No specific exacerbating or alleviating factors. Occurs at rest. States initially felt like heartburn or indigestion that she has had in past, but as was persistent, wanted to get checked. No chest wall injury or strain. Occasional non prod cough. No sore throat, runny nose, or other uri c/o. No fever or chills. No pleuritic pain. No leg pain or swelling. Pt denies any unusual doe or fatigue. No nv. No diaphoresis  R/O normal ECG, CXR and tropoinin  Some relief with GI cocktail  Patient is DNR   Past Medical History  Diagnosis Date  . Localized superficial swelling, mass, or lump   . Paralysis agitans   . Urinary tract infection, site not specified   . Allergic rhinitis, cause unspecified   .  Diaphragmatic hernia without mention of obstruction or gangrene   . 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 Chronic kidney disease, unspecified   . Unspecified constipation   . Myalgia and myositis, unspecified   . Peripheral vascular disease, unspecified   . Senile osteoporosis   . GERD (gastroesophageal reflux disease)   . Major depressive disorder, recurrent episode, moderate   . Hypertension   . Hyperlipidemia   . Parkinson disease   . Degeneration of lumbar or lumbosacral intervertebral disc   . Rheumatoid arthritis(714.0)   . Generalized osteoarthrosis, involving multiple sites     History reviewed. No pertinent past surgical history.   Current Outpatient Prescriptions  Medication Sig Dispense Refill  . APOMORPHINE HYDROCHLORIDE (APOKYN) 10 MG/ML SOLN Inject 0.2 mLs into the skin. up to 5 times a day prn increased parkinson's sx    . Camphor-Menthol-Methyl Sal (SALONPAS) 1.2-5.7-6.3 % PTCH Apply 1 patch topically daily. Remove after 12 hours    . carbidopa-levodopa-entacapone (STALEVO) 50-200-200 MG per tablet Take 1 tablet by mouth 3 (three) times daily.    . carboxymethylcellulose (REFRESH PLUS) 0.5 % SOLN Apply 1 drop to eye 3 (three) times daily as needed (dry eyes).     . Cranberry-Vitamin C-Inulin (UTI-STAT PO) Take 30 mLs  by mouth 2 (two) times daily.    . DULoxetine (CYMBALTA) 60 MG capsule Take 60 mg by mouth daily.    . fentaNYL (DURAGESIC - DOSED MCG/HR) 25 MCG/HR patch Apply one patch topically every 72 hours. Remove old patch before placement. Rotate sites 10 patch 0  . folic acid (FOLVITE) 1 MG tablet Take 1 mg by mouth daily.    Marland Kitchen HYDROmorphone (DILAUDID) 4 MG tablet Take one tablet by mouth every 8 hours as needed for pain. One tablet per shift. 90 tablet 0  . hydroxychloroquine (PLAQUENIL) 200 MG tablet Take 200 mg by mouth 2 (two) times daily.    Marland Kitchen  ipratropium-albuterol (DUONEB) 0.5-2.5 (3) MG/3ML SOLN Take 3 mLs by nebulization every 6 (six) hours as needed. Pt gets treatment every 8 hours PRN for shortness of breath    . loratadine (CLARITIN) 10 MG tablet Take 10 mg by mouth daily as needed for allergies.    . metoprolol succinate (TOPROL-XL) 25 MG 24 hr tablet Take 25 mg by mouth daily.    . nitroGLYCERIN (NITROSTAT) 0.4 MG SL tablet Place 0.4 mg under the tongue every 5 (five) minutes as needed for chest pain.    . NON FORMULARY Take 120 mLs by mouth 2 (two) times daily. Med Pass    . NON FORMULARY Take 1 tablet by mouth 3 (three) times daily. Move Free Advanced   500/67/500mg     . Omega-3 Fatty Acids (FISH OIL BURP-LESS) 1000 MG CAPS Take 1 capsule by mouth daily.    Marland Kitchen omeprazole (PRILOSEC) 40 MG capsule Take 40 mg by mouth daily.    Jeralyn Bennett Shell 500 MG TABS Take 500 mg by mouth 2 (two) times daily.    . pantoprazole (PROTONIX) 40 MG tablet Take 40 mg by mouth daily.    . polyethylene glycol (MIRALAX / GLYCOLAX) packet Take 17 g by mouth daily.    . pramipexole (MIRAPEX) 1 MG tablet Take 2 mg by mouth 3 (three) times daily.    . predniSONE (DELTASONE) 5 MG tablet Take 5 mg by mouth daily with breakfast.    . Propylene Glycol 0.6 % SOLN Apply 1 drop to eye 3 (three) times daily.    . sennosides-docusate sodium (SENOKOT-S) 8.6-50 MG tablet Take 1 tablet by mouth 2 (two) times daily.    . Vitamin D, Ergocalciferol, (DRISDOL) 50000 UNITS CAPS capsule Take 50,000 Units by mouth every 28 (twenty-eight) days.     No current facility-administered medications for this visit.    Allergies:   Ampicillin    Social History:  The patient  reports that she has never smoked. She does not have any smokeless tobacco history on file. She reports that she does not drink alcohol or use illicit drugs.   Family History:  The patient's family history is not on file.    ROS:  Please see the history of present illness.   Otherwise, review of systems  are positive for none.   All other systems are reviewed and negative.    PHYSICAL EXAM: VS:  BP 90/62 mmHg  Pulse 81  Ht 5\' 5"  (1.651 m)  Wt   SpO2 91% , BMI Body mass index is 0.00 kg/(m^2). GEN: Well nourished, well developed, in no acute distress HEENT: normal Neck: no JVD, carotid bruits, or masses Cardiac:  RRR; no murmurs, rubs, or gallops,no edema  Respiratory:  clear to auscultation bilaterally, normal work of breathing GI: soft, nontender, nondistended, + BS MS: no deformity or atrophy Skin:  warm and dry, no rash Neuro:  Strength and sensation are intact Psych: euthymic mood, full affect   EKG:   02/05/14  SR rate 75  Normal ECG    Recent Labs: 02/04/2014: ALT 6; BUN 12; Creatinine 0.70; Hemoglobin 11.9*; Platelets 231; Potassium 4.0; Sodium 140    Lipid Panel No results found for: CHOL, TRIG, HDL, CHOLHDL, VLDL, LDLCALC, LDLDIRECT    Wt Readings from Last 3 Encounters:  02/04/14 55.792 kg (123 lb)  08/12/12 54.432 kg (120 lb)      Other studies Reviewed: Additional studies/ records that were reviewed today include: Assisted living and ER notes.    ASSESSMENT AND PLAN:  1.  Chest Pain:  Vague with negative w/u in elderly wheel chair bound female who is DNR  No further w/u needed 2. Epigastric pain:  Continue protonix consider GI evaluation as this seems to be the source of her pain 3. Paralysis - no CVA  On sinemet  F/u neuro    Current medicines are reviewed at length with the patient today.  The patient does not have concerns regarding medicines.  The following changes have been made:  no change  Labs/ tests ordered today include:  None  No orders of the defined types were placed in this encounter.     Disposition:   FU with PRN        Signed, Charlton Haws, MD  03/06/2014 4:35 PM    [facility] [address] [place], [place], [place]  [address] Phone: [phone]; Fax: [phone]

## 2014-03-06 NOTE — Patient Instructions (Signed)
Your physician recommends that you schedule a follow-up appointment in: AS NEEDED  Your physician recommends that you continue on your current medications as directed. Please refer to the Current Medication list given to you today.  

## 2014-03-20 ENCOUNTER — Other Ambulatory Visit: Payer: Self-pay | Admitting: *Deleted

## 2014-03-20 MED ORDER — FENTANYL 25 MCG/HR TD PT72: MEDICATED_PATCH | TRANSDERMAL | Status: DC

## 2014-03-20 NOTE — Telephone Encounter (Signed)
[facility] #[phone] Fax: [phone] 

## 2014-03-26 ENCOUNTER — Non-Acute Institutional Stay (SKILLED_NURSING_FACILITY): Payer: Medicare Other | Admitting: Internal Medicine

## 2014-03-26 ENCOUNTER — Other Ambulatory Visit: Payer: Self-pay | Admitting: Internal Medicine

## 2014-03-26 DIAGNOSIS — M5432 Sciatica, left side: Secondary | ICD-10-CM | POA: Diagnosis not present

## 2014-03-26 DIAGNOSIS — J301 Allergic rhinitis due to pollen: Secondary | ICD-10-CM

## 2014-03-26 DIAGNOSIS — R1311 Dysphagia, oral phase: Secondary | ICD-10-CM | POA: Diagnosis not present

## 2014-03-26 DIAGNOSIS — F331 Major depressive disorder, recurrent, moderate: Secondary | ICD-10-CM

## 2014-03-26 DIAGNOSIS — K219 Gastro-esophageal reflux disease without esophagitis: Secondary | ICD-10-CM

## 2014-03-26 DIAGNOSIS — R109 Unspecified abdominal pain: Secondary | ICD-10-CM

## 2014-03-26 NOTE — Progress Notes (Signed)
MRN: [id number] Name: Sheri Molina  Sex: female Age: 81 y.o. DOB: [birth year]  PSC #: [facility] Dandy Facility/Room: Level Of Care: SNF Provider: Merrilee Seashore D Emergency Contacts: Extended Emergency Contact Information Primary Emergency Contact: Rago,Joel Address: [address] [place], [place] [address] [place] Home Phone: [phone] Relation: Son  Code Status: DNR  Allergies: Ampicillin  Chief Complaint  Patient presents with  . Medical Management of Chronic Issues    HPI: Patient is 81 y.o. female who is being seen for routine issues.  Past Medical History  Diagnosis Date  . Localized superficial swelling, mass, or lump   . Paralysis agitans   . Urinary tract infection, site not specified   . Allergic rhinitis, cause unspecified   . Diaphragmatic hernia without mention of obstruction or gangrene   . 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 Chronic kidney disease, unspecified   . Unspecified constipation   . Myalgia and myositis, unspecified   . Peripheral vascular disease, unspecified   . Senile osteoporosis   . GERD (gastroesophageal reflux disease)   . Major depressive disorder, recurrent episode, moderate   . Hypertension   . Hyperlipidemia   . Parkinson disease   . Degeneration of lumbar or lumbosacral intervertebral disc   . Rheumatoid arthritis(714.0)   . Generalized osteoarthrosis, involving multiple sites     No past surgical history on file.    Medication List       This list is accurate as of: 03/26/14 11:59 PM.  Always use your most recent med list.               APOKYN 10 MG/ML Soln  Generic drug:  APOMORPHINE HYDROCHLORIDE  Inject 0.2 mLs into the skin. up to 5 times a day prn increased parkinson's sx     carbidopa-levodopa-entacapone 50-200-200 MG per tablet  Commonly known as:  STALEVO  Take 1  tablet by mouth 3 (three) times daily.     carboxymethylcellulose 0.5 % Soln  Commonly known as:  REFRESH PLUS  Apply 1 drop to eye 3 (three) times daily as needed (dry eyes).     DULoxetine 60 MG capsule  Commonly known as:  CYMBALTA  Take 60 mg by mouth daily.     fentaNYL 25 MCG/HR patch  Commonly known as:  DURAGESIC - dosed mcg/hr  Apply one patch topically every 72 hours. Remove old patch before placement. Rotate sites     FISH OIL BURP-LESS 1000 MG Caps  Take 1 capsule by mouth daily.     folic acid 1 MG tablet  Commonly known as:  FOLVITE  Take 1 mg by mouth daily.     HYDROmorphone 4 MG tablet  Commonly known as:  DILAUDID  Take one tablet by mouth every 8 hours as needed for pain. One tablet per shift.     hydroxychloroquine 200 MG tablet  Commonly known as:  PLAQUENIL  Take 200 mg by mouth 2 (two) times daily.     ipratropium-albuterol 0.5-2.5 (3) MG/3ML Soln  Commonly known as:  DUONEB  Take 3 mLs by nebulization every 6 (six) hours as needed. Pt gets treatment every 8 hours PRN for shortness of breath     loratadine 10 MG tablet  Commonly known as:  CLARITIN  Take 10 mg by mouth daily as needed for allergies.  metoprolol succinate 25 MG 24 hr tablet  Commonly known as:  TOPROL-XL  Take 25 mg by mouth daily.     MIRAPEX 1 MG tablet  Generic drug:  pramipexole  Take 2 mg by mouth 3 (three) times daily.     nitroGLYCERIN 0.4 MG SL tablet  Commonly known as:  NITROSTAT  Place 0.4 mg under the tongue every 5 (five) minutes as needed for chest pain.     NON FORMULARY  Take 120 mLs by mouth 2 (two) times daily. Med Pass     NON FORMULARY  Take 1 tablet by mouth 3 (three) times daily. Move Free Advanced   500/67/500mg      omeprazole 40 MG capsule  Commonly known as:  PRILOSEC  Take 40 mg by mouth daily.     Oyster Shell 500 MG Tabs  Take 500 mg by mouth 2 (two) times daily.     pantoprazole 40 MG tablet  Commonly known as:  PROTONIX  Take 40 mg  by mouth daily.     polyethylene glycol packet  Commonly known as:  MIRALAX / GLYCOLAX  Take 17 g by mouth daily.     predniSONE 5 MG tablet  Commonly known as:  DELTASONE  Take 5 mg by mouth daily with breakfast.     Propylene Glycol 0.6 % Soln  Apply 1 drop to eye 3 (three) times daily.     SALONPAS 1.2-5.7-6.3 % Ptch  Generic drug:  Camphor-Menthol-Methyl Sal  Apply 1 patch topically daily. Remove after 12 hours     sennosides-docusate sodium 8.6-50 MG tablet  Commonly known as:  SENOKOT-S  Take 1 tablet by mouth 2 (two) times daily.     UTI-STAT PO  Take 30 mLs by mouth 2 (two) times daily.     Vitamin D (Ergocalciferol) 50000 UNITS Caps capsule  Commonly known as:  DRISDOL  Take 50,000 Units by mouth every 28 (twenty-eight) days.        No orders of the defined types were placed in this encounter.     There is no immunization history on file for this patient.  History  Substance Use Topics  . Smoking status: Never Smoker   . Smokeless tobacco: Not on file  . Alcohol Use: No    Review of Systems  DATA OBTAINED: from patient, nurse, medical record GENERAL:  no fevers, fatigue, appetite changes SKIN: No itching, rash HEENT: No complaint RESPIRATORY: No cough, wheezing, SOB CARDIAC: No chest pain, palpitations, lower extremity edema  GI: No abdominal pain, No N/V/D or constipation; probable reflux sx GU: No dysuria, frequency or urgency, or incontinence  MUSCULOSKELETAL: No unrelieved bone/joint pain NEUROLOGIC: No headache, dizziness  PSYCHIATRIC: No overt anxiety or sadness  Filed Vitals:   03/26/14 2118  BP: 110/59  Pulse: 61  Temp: 97 F (36.1 C)  Resp: 19    Physical Exam  GENERAL APPEARANCE: Alert, conversant, No acute distress  SKIN: No diaphoresis rash HEENT: Unremarkable RESPIRATORY: Breathing is even, unlabored. Lung sounds are clear   CARDIOVASCULAR: Heart RRR no murmurs, rubs or gallops. No peripheral edema  GASTROINTESTINAL:  Abdomen is soft, non-tender, not distended w/ normal bowel sounds.  GENITOURINARY: Bladder non tender, not distended  MUSCULOSKELETAL: No abnormal joints or musculature NEUROLOGIC: Cranial nerves 2-12 grossly intact. Moves all extremities PSYCHIATRIC: Mood and affect appropriate to situation, no behavioral issues  Patient Active Problem List   Diagnosis Date Noted  . Sciatica of left side 04/14/2014  . Peripheral neuropathy 02/26/2014  .  Chronic pain syndrome 01/24/2014  . Dysphagia, oral phase 10/08/2013  . GERD (gastroesophageal reflux disease)   . Hypertension   . Hyperlipidemia   . Localized superficial swelling, mass, or lump   . Paralysis agitans   . Urinary tract infection, site not specified   . Allergic rhinitis   . Major depressive disorder, recurrent episode, moderate   . Diaphragmatic hernia without mention of obstruction or gangrene   . 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 Chronic kidney disease, stage 2, mildly decreased GFR   . Unspecified constipation   . Myalgia and myositis, unspecified   . Peripheral vascular disease, unspecified   . Degeneration of lumbar or lumbosacral intervertebral disc   . Rheumatoid arthritis involving multiple joints   . Senile osteoporosis   . Generalized osteoarthrosis, involving multiple sites     CBC    Component Value Date/Time   WBC 6.6 04/07/2014 1506   RBC 3.97 04/07/2014 1506   HGB 12.3 04/07/2014 1506   HCT 37.2 04/07/2014 1506   PLT 136* 04/07/2014 1506   MCV 93.7 04/07/2014 1506   LYMPHSABS 1.0 04/07/2014 1506   MONOABS 0.5 04/07/2014 1506   EOSABS 0.0 04/07/2014 1506   BASOSABS 0.0 04/07/2014 1506    CMP     Component Value Date/Time   NA 139 04/07/2014 1506   K 3.7 04/07/2014 1506   CL 103 04/07/2014 1506   CO2 26 04/07/2014 1506   GLUCOSE 136* 04/07/2014 1506   BUN 15 04/07/2014 1506    CREATININE 0.78 04/07/2014 1506   CALCIUM 9.0 04/07/2014 1506   PROT 5.9* 04/07/2014 1506   ALBUMIN 3.5 04/07/2014 1506   AST 34 04/07/2014 1506   ALT 6 04/07/2014 1506   ALKPHOS 51 04/07/2014 1506   BILITOT 0.3 04/07/2014 1506   GFRNONAA 76* 04/07/2014 1506   GFRAA 88* 04/07/2014 1506    Assessment and Plan  Major depressive disorder, recurrent episode, moderate No current signs or sx of depression;Plan-continue cymbalta 60 mg daily   Allergic rhinitis Due to [place]m trees with recent flare;Plan - change claritin to 10 mg scheduled during the season   Dysphagia, oral phase Stable weights without signs or sx of aspiration;currently on restorative for eating/swallowing; stable weights;Plan-continue current tx  And monitoring   GERD (gastroesophageal reflux disease) Pt had been without sx but sx have started;visit to ED with neg cardiac w/u; will start protonix 40 mg BID for 4 weeks, then daily for at least 4 weeks, then monitor   Sciatica of left side Earlier in month pt had flare, treated with 5 days prednisone, appears to be improving.     Margit Hanks, MD

## 2014-03-28 ENCOUNTER — Other Ambulatory Visit (HOSPITAL_COMMUNITY): Payer: Self-pay | Admitting: Cardiology

## 2014-03-28 ENCOUNTER — Ambulatory Visit (HOSPITAL_COMMUNITY): Payer: Medicare Other | Attending: Internal Medicine | Admitting: *Deleted

## 2014-03-28 DIAGNOSIS — L819 Disorder of pigmentation, unspecified: Secondary | ICD-10-CM | POA: Diagnosis not present

## 2014-03-28 DIAGNOSIS — R0989 Other specified symptoms and signs involving the circulatory and respiratory systems: Secondary | ICD-10-CM | POA: Insufficient documentation

## 2014-03-28 DIAGNOSIS — M79643 Pain in unspecified hand: Secondary | ICD-10-CM | POA: Insufficient documentation

## 2014-03-28 NOTE — Progress Notes (Signed)
Upper Extremity Arterial Multilevel - Performed

## 2014-04-02 ENCOUNTER — Ambulatory Visit (HOSPITAL_COMMUNITY)
Admission: RE | Admit: 2014-04-02 | Discharge: 2014-04-02 | Disposition: A | Discharge status: Home / Self Care | Payer: Medicare Other | Source: Ambulatory Visit | Attending: Internal Medicine | Admitting: Internal Medicine

## 2014-04-02 DIAGNOSIS — R102 Pelvic and perineal pain: Secondary | ICD-10-CM | POA: Insufficient documentation

## 2014-04-02 DIAGNOSIS — R109 Unspecified abdominal pain: Secondary | ICD-10-CM

## 2014-04-07 ENCOUNTER — Emergency Department (HOSPITAL_COMMUNITY): Payer: Medicare Other

## 2014-04-07 ENCOUNTER — Emergency Department (HOSPITAL_COMMUNITY)
Admission: EM | Admit: 2014-04-07 | Discharge: 2014-04-07 | Disposition: A | Discharge status: Home / Self Care | Payer: Medicare Other | Attending: Emergency Medicine | Admitting: Emergency Medicine

## 2014-04-07 ENCOUNTER — Telehealth: Payer: Self-pay | Admitting: Internal Medicine

## 2014-04-07 ENCOUNTER — Encounter (HOSPITAL_COMMUNITY): Payer: Self-pay | Admitting: General Practice

## 2014-04-07 DIAGNOSIS — R109 Unspecified abdominal pain: Secondary | ICD-10-CM | POA: Insufficient documentation

## 2014-04-07 DIAGNOSIS — Z7952 Long term (current) use of systemic steroids: Secondary | ICD-10-CM | POA: Insufficient documentation

## 2014-04-07 DIAGNOSIS — R0602 Shortness of breath: Secondary | ICD-10-CM | POA: Diagnosis not present

## 2014-04-07 DIAGNOSIS — R0789 Other chest pain: Secondary | ICD-10-CM | POA: Insufficient documentation

## 2014-04-07 DIAGNOSIS — R079 Chest pain, unspecified: Secondary | ICD-10-CM

## 2014-04-07 DIAGNOSIS — Z8639 Personal history of other endocrine, nutritional and metabolic disease: Secondary | ICD-10-CM | POA: Diagnosis not present

## 2014-04-07 DIAGNOSIS — G20 Parkinson's disease: Secondary | ICD-10-CM | POA: Insufficient documentation

## 2014-04-07 DIAGNOSIS — M069 Rheumatoid arthritis, unspecified: Secondary | ICD-10-CM | POA: Insufficient documentation

## 2014-04-07 DIAGNOSIS — Z87440 Personal history of urinary (tract) infections: Secondary | ICD-10-CM | POA: Diagnosis not present

## 2014-04-07 DIAGNOSIS — Z79899 Other long term (current) drug therapy: Secondary | ICD-10-CM | POA: Diagnosis not present

## 2014-04-07 DIAGNOSIS — K219 Gastro-esophageal reflux disease without esophagitis: Secondary | ICD-10-CM | POA: Diagnosis not present

## 2014-04-07 DIAGNOSIS — I1310 Hypertensive heart and chronic kidney disease without heart failure, with stage 1 through stage 4 chronic kidney disease, or unspecified chronic kidney disease: Secondary | ICD-10-CM | POA: Insufficient documentation

## 2014-04-07 DIAGNOSIS — N189 Chronic kidney disease, unspecified: Secondary | ICD-10-CM | POA: Insufficient documentation

## 2014-04-07 LAB — COMPREHENSIVE METABOLIC PANEL
ALT: 6 U/L (ref 0–35)
AST: 34 U/L (ref 0–37)
Albumin: 3.5 g/dL (ref 3.5–5.2)
Alkaline Phosphatase: 51 U/L (ref 39–117)
Anion gap: 10 (ref 5–15)
BILIRUBIN TOTAL: 0.3 mg/dL (ref 0.3–1.2)
BUN: 15 mg/dL (ref 6–23)
CO2: 26 mmol/L (ref 19–32)
Calcium: 9 mg/dL (ref 8.4–10.5)
Chloride: 103 mmol/L (ref 96–112)
Creatinine, Ser: 0.78 mg/dL (ref 0.50–1.10)
GFR calc Af Amer: 88 mL/min — ABNORMAL LOW (ref >90)
GFR calc non Af Amer: 76 mL/min — ABNORMAL LOW (ref >90)
GLUCOSE: 136 mg/dL — AB (ref 70–99)
Potassium: 3.7 mmol/L (ref 3.5–5.1)
Sodium: 139 mmol/L (ref 135–145)
Total Protein: 5.9 g/dL — ABNORMAL LOW (ref 6.0–8.3)

## 2014-04-07 LAB — I-STAT TROPONIN, ED
Troponin i, poc: 0.01 ng/mL (ref 0.00–0.08)
Troponin i, poc: 0 ng/mL (ref 0.00–0.08)

## 2014-04-07 LAB — CBC WITH DIFFERENTIAL/PLATELET
BASOS PCT: 0 % (ref 0–1)
Basophils Absolute: 0 10*3/uL (ref 0.0–0.1)
EOS PCT: 0 % (ref 0–5)
Eosinophils Absolute: 0 10*3/uL (ref 0.0–0.7)
HCT: 37.2 % (ref 36.0–46.0)
Hemoglobin: 12.3 g/dL (ref 12.0–15.0)
Lymphocytes Relative: 15 % (ref 12–46)
Lymphs Abs: 1 10*3/uL (ref 0.7–4.0)
MCH: 31 pg (ref 26.0–34.0)
MCHC: 33.1 g/dL (ref 30.0–36.0)
MCV: 93.7 fL (ref 78.0–100.0)
MONOS PCT: 7 % (ref 3–12)
Monocytes Absolute: 0.5 10*3/uL (ref 0.1–1.0)
NEUTROS ABS: 5.1 10*3/uL (ref 1.7–7.7)
Neutrophils Relative %: 78 % — ABNORMAL HIGH (ref 43–77)
Platelets: 136 10*3/uL — ABNORMAL LOW (ref 150–400)
RBC: 3.97 MIL/uL (ref 3.87–5.11)
RDW: 13.2 % (ref 11.5–15.5)
WBC: 6.6 10*3/uL (ref 4.0–10.5)

## 2014-04-07 LAB — D-DIMER, QUANTITATIVE: D-Dimer, Quant: 0.61 ug/mL-FEU — ABNORMAL HIGH (ref 0.00–0.48)

## 2014-04-07 LAB — LIPASE, BLOOD: Lipase: 23 U/L (ref 11–59)

## 2014-04-07 NOTE — ED Notes (Signed)
CALLED [facility] FOR TRANSPORT °

## 2014-04-07 NOTE — ED Notes (Signed)
NAD at this time. Pt is stable and going back to facility.

## 2014-04-07 NOTE — ED Provider Notes (Signed)
CSN: [id number]     Arrival date & time 04/07/14  1434 History   First MD Initiated Contact with Patient 04/07/14 1457     Chief Complaint  Patient presents with  . Chest Pain     (Consider location/radiation/quality/duration/timing/severity/associated sxs/prior Treatment) HPI Patient complains of left anterior chest pain onset 2 months ago intermittent, nonradiating. She does admit to shortness of breath which is chronic, unchanged. No other associated symptoms nothing makes symptoms better or worse. Blood pressure noted to be 94/65 at skilled nursing facility earlier today. No other associated symptoms. She is presently asymptomatic. No treatment prior to coming here. Brought by EMS. Patient had cardiac evaluation by Dr.Nishan 03/06/2014 for similar complaint. No further workup needed per Dr. Eden Emms Past Medical History  Diagnosis Date  . Localized superficial swelling, mass, or lump   . Paralysis agitans   . Urinary tract infection, site not specified   . Allergic rhinitis, cause unspecified   . Diaphragmatic hernia without mention of obstruction or gangrene   . 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 Chronic kidney disease, unspecified   . Unspecified constipation   . Myalgia and myositis, unspecified   . Peripheral vascular disease, unspecified   . Senile osteoporosis   . GERD (gastroesophageal reflux disease)   . Major depressive disorder, recurrent episode, moderate   . Hypertension   . Hyperlipidemia   . Parkinson disease   . Degeneration of lumbar or lumbosacral intervertebral disc   . Rheumatoid arthritis(714.0)   . Generalized osteoarthrosis, involving multiple sites    History reviewed. No pertinent past surgical history. No family history on file. History  Substance Use Topics  . Smoking status: Never Smoker   . Smokeless tobacco: Not on file  . Alcohol Use:  No   DO NOT RESUSCITATE CODE STATUS OB History    No data available     Review of Systems  Respiratory: Positive for shortness of breath.   Cardiovascular: Positive for chest pain.  Gastrointestinal: Positive for abdominal pain.       Abdominal discomfort for several months. None presently  Musculoskeletal: Positive for gait problem.       Wheelchair-bound      Allergies  Ampicillin  Home Medications   Prior to Admission medications   Medication Sig Start Date End Date Taking? Authorizing Provider  APOMORPHINE HYDROCHLORIDE (APOKYN) 10 MG/ML SOLN Inject 0.2 mLs into the skin. up to 5 times a day prn increased parkinson's sx    Historical Provider, MD  Camphor-Menthol-Methyl Sal (SALONPAS) 1.2-5.7-6.3 % PTCH Apply 1 patch topically daily. Remove after 12 hours    Historical Provider, MD  carbidopa-levodopa-entacapone (STALEVO) 50-200-200 MG per tablet Take 1 tablet by mouth 3 (three) times daily.    Historical Provider, MD  carboxymethylcellulose (REFRESH PLUS) 0.5 % SOLN Apply 1 drop to eye 3 (three) times daily as needed (dry eyes).     Historical Provider, MD  Cranberry-Vitamin C-Inulin (UTI-STAT PO) Take 30 mLs by mouth 2 (two) times daily.    Historical Provider, MD  DULoxetine (CYMBALTA) 60 MG capsule Take 60 mg by mouth daily.    Historical Provider, MD  fentaNYL (DURAGESIC - DOSED MCG/HR) 25 MCG/HR patch Apply one patch topically every 72 hours. Remove old patch before placement. Rotate sites 03/20/14   Oneal Grout, MD  folic acid (FOLVITE) 1 MG tablet Take 1 mg by mouth daily.    Historical  Provider, MD  HYDROmorphone (DILAUDID) 4 MG tablet Take one tablet by mouth every 8 hours as needed for pain. One tablet per shift. 08/01/13   Oneal Grout, MD  hydroxychloroquine (PLAQUENIL) 200 MG tablet Take 200 mg by mouth 2 (two) times daily.    Historical Provider, MD  ipratropium-albuterol (DUONEB) 0.5-2.5 (3) MG/3ML SOLN Take 3 mLs by nebulization every 6 (six) hours as needed.  Pt gets treatment every 8 hours PRN for shortness of breath    Historical Provider, MD  loratadine (CLARITIN) 10 MG tablet Take 10 mg by mouth daily as needed for allergies.    Historical Provider, MD  metoprolol succinate (TOPROL-XL) 25 MG 24 hr tablet Take 25 mg by mouth daily.    Historical Provider, MD  nitroGLYCERIN (NITROSTAT) 0.4 MG SL tablet Place 0.4 mg under the tongue every 5 (five) minutes as needed for chest pain.    Historical Provider, MD  NON FORMULARY Take 120 mLs by mouth 2 (two) times daily. Med Pass    Historical Provider, MD  NON FORMULARY Take 1 tablet by mouth 3 (three) times daily. Move Free Advanced   500/67/500mg     Historical Provider, MD  Omega-3 Fatty Acids (FISH OIL BURP-LESS) 1000 MG CAPS Take 1 capsule by mouth daily.    Historical Provider, MD  omeprazole (PRILOSEC) 40 MG capsule Take 40 mg by mouth daily.    Historical Provider, MD  Oyster Shell 500 MG TABS Take 500 mg by mouth 2 (two) times daily.    Historical Provider, MD  pantoprazole (PROTONIX) 40 MG tablet Take 40 mg by mouth daily.    Historical Provider, MD  polyethylene glycol (MIRALAX / GLYCOLAX) packet Take 17 g by mouth daily.    Historical Provider, MD  pramipexole (MIRAPEX) 1 MG tablet Take 2 mg by mouth 3 (three) times daily.    Historical Provider, MD  predniSONE (DELTASONE) 5 MG tablet Take 5 mg by mouth daily with breakfast.    Historical Provider, MD  Propylene Glycol 0.6 % SOLN Apply 1 drop to eye 3 (three) times daily.    Historical Provider, MD  sennosides-docusate sodium (SENOKOT-S) 8.6-50 MG tablet Take 1 tablet by mouth 2 (two) times daily.    Historical Provider, MD  Vitamin D, Ergocalciferol, (DRISDOL) 50000 UNITS CAPS capsule Take 50,000 Units by mouth every 28 (twenty-eight) days.    Historical Provider, MD   BP 109/58 mmHg  Pulse 64  Resp 17  SpO2 93% Physical Exam  Constitutional: She is oriented to person, place, and time. She appears well-developed and well-nourished. No  distress.  Frail, chronically ill-appearing  HENT:  Head: Normocephalic and atraumatic.  Eyes: Conjunctivae are normal. Pupils are equal, round, and reactive to light.  Neck: Neck supple. No tracheal deviation present. No thyromegaly present.  Cardiovascular: Normal rate and regular rhythm.   No murmur heard. Pulmonary/Chest: Effort normal and breath sounds normal.  Abdominal: Soft. Bowel sounds are normal. She exhibits no distension. There is no tenderness.  Musculoskeletal: Normal range of motion. She exhibits no edema or tenderness.  All 4 extremities neurovascularly intact  Neurological: She is alert and oriented to person, place, and time. No cranial nerve deficit. Coordination normal.  Skin: Skin is warm and dry. No rash noted.  Psychiatric: She has a normal mood and affect.  Nursing note and vitals reviewed.   ED Course  Procedures (including critical care time) Labs Review Labs Reviewed - No data to display  Imaging Review No results found.   EKG  Interpretation   Date/Time:  Saturday April 07 2014 14:44:10 EDT Ventricular Rate:  67 PR Interval:  198 QRS Duration: 82 QT Interval:  414 QTC Calculation: 437 R Axis:   -24 Text Interpretation:  Sinus rhythm Left ventricular hypertrophy Inferior  infarct, old Anterior Q waves, possibly due to LVH Lateral leads are also  involved No significant change since last tracing Confirmed by Ethelda Chick   MD, Robbie Nangle [phone]) on 04/07/2014 2:57:53 PM     6:45 PM patient resting comfortably. Asymptomatic Chest x-ray viewed by me Results for orders placed or performed during the hospital encounter of 04/07/14  Comprehensive metabolic panel  Result Value Ref Range   Sodium 139 135 - 145 mmol/L   Potassium 3.7 3.5 - 5.1 mmol/L   Chloride 103 96 - 112 mmol/L   CO2 26 19 - 32 mmol/L   Glucose, Bld 136 (H) 70 - 99 mg/dL   BUN 15 6 - 23 mg/dL   Creatinine, Ser 8.65 0.50 - 1.10 mg/dL   Calcium 9.0 8.4 - 78.4 mg/dL   Total Protein 5.9  (L) 6.0 - 8.3 g/dL   Albumin 3.5 3.5 - 5.2 g/dL   AST 34 0 - 37 U/L   ALT 6 0 - 35 U/L   Alkaline Phosphatase 51 39 - 117 U/L   Total Bilirubin 0.3 0.3 - 1.2 mg/dL   GFR calc non Af Amer 76 (L) >90 mL/min   GFR calc Af Amer 88 (L) >90 mL/min   Anion gap 10 5 - 15  CBC with Differential/Platelet  Result Value Ref Range   WBC 6.6 4.0 - 10.5 K/uL   RBC 3.97 3.87 - 5.11 MIL/uL   Hemoglobin 12.3 12.0 - 15.0 g/dL   HCT 69.6 29.5 - 28.4 %   MCV 93.7 78.0 - 100.0 fL   MCH 31.0 26.0 - 34.0 pg   MCHC 33.1 30.0 - 36.0 g/dL   RDW 13.2 44.0 - 10.2 %   Platelets 136 (L) 150 - 400 K/uL   Neutrophils Relative % 78 (H) 43 - 77 %   Lymphocytes Relative 15 12 - 46 %   Monocytes Relative 7 3 - 12 %   Eosinophils Relative 0 0 - 5 %   Basophils Relative 0 0 - 1 %   Neutro Abs 5.1 1.7 - 7.7 K/uL   Lymphs Abs 1.0 0.7 - 4.0 K/uL   Monocytes Absolute 0.5 0.1 - 1.0 K/uL   Eosinophils Absolute 0.0 0.0 - 0.7 K/uL   Basophils Absolute 0.0 0.0 - 0.1 K/uL   Smear Review MORPHOLOGY UNREMARKABLE   Lipase, blood  Result Value Ref Range   Lipase 23 11 - 59 U/L  D-dimer, quantitative  Result Value Ref Range   D-Dimer, Quant 0.61 (H) 0.00 - 0.48 ug/mL-FEU  I-stat troponin, ED  Result Value Ref Range   Troponin i, poc 0.01 0.00 - 0.08 ng/mL   Comment 3          I-stat troponin, ED  Result Value Ref Range   Troponin i, poc 0.00 0.00 - 0.08 ng/mL   Comment 3           Ct Abdomen Pelvis Wo Contrast  04/02/2014   CLINICAL DATA:  81 year old female with abdominal and pelvic pain.  EXAM: CT ABDOMEN AND PELVIS WITHOUT CONTRAST  TECHNIQUE: Multidetector CT imaging of the abdomen and pelvis was performed following the standard protocol without IV contrast. Please note that due to technical difficulties, no reformatted images could be generated.  COMPARISON:  None.  FINDINGS: Please note that parenchymal abnormalities may be missed without intravenous contrast.  Lower chest:  Mild bibasilar atelectasis/scarring is  noted.  Hepatobiliary: The visualized liver is unremarkable. The gallbladder is distended. No definite biliary dilatation identified.  Pancreas: Unremarkable  Spleen: Unremarkable except for calcified granulomatous disease.  Adrenals/Urinary Tract: The kidneys, adrenal glands and bladder are unremarkable.  Stomach/Bowel: A moderate amount of stool within the colon and rectum noted. There is no evidence bowel obstruction or pneumoperitoneum.  Vascular/Lymphatic: No enlarged lymph nodes or abdominal aortic aneurysm.  Reproductive: Patient is status post hysterectomy. No adnexal masses identified.  Other: Is small amount of free fluid within the pelvis is noted.  Musculoskeletal: A moderate to severe lumbar scoliosis and degenerative changes identified. No acute bony abnormalities are noted.  IMPRESSION: Small amount of free pelvic fluid -nonspecific.  Distended gallbladder without other CT evidence of acute cholecystitis. Consider ultrasound as clinically indicated.  Moderate colonic and rectal stool.  Bibasilar atelectasis/scarring.   Electronically Signed   By: Harmon Pier M.D.   On: 04/02/2014 15:19   Dg Chest 2 View  04/07/2014   CLINICAL DATA:  Anterior chest pain radiating towards the left. Hypertension.  EXAM: CHEST  2 VIEW  COMPARISON:  02/04/2014  FINDINGS: Right humeral head dislocated medially. Left humeral head absent, with left proximal humeral shaft dislocated medially and with deformity of the left glenoid.  Old granulomatous disease noted. Mild enlargement of the cardiopericardial silhouette.  Subsegmental atelectasis at both lung bases. Airway thickening is present, suggesting bronchitis or reactive airways disease. Tortuosity and atherosclerotic calcification of the thoracic aorta. No pulmonary edema.  IMPRESSION: 1. Cardiomegaly, without edema. 2. Airway thickening is present, suggesting bronchitis or reactive airways disease. 3. Right glenohumeral joint dislocation, chronic. 4. 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 The   Electronically Signed   By: Gaylyn Rong M.D.   On: 04/07/2014 16:09    MDM  Age adjusted d-dimer negative for pulmonary embolism. Low pretest clinical probability for pulmonary embolism Final diagnoses:  None  no evidence of acute coronary syndrome Plan return to skilled nursing facility Diagnosis nonspecific chest pain     Doug Sou, MD 04/07/14 1849

## 2014-04-07 NOTE — Discharge Instructions (Signed)
Chest Pain (Nonspecific) Contact Ms. Maret primary care physician or have her return to the emergency department if concern for any reason It is often hard to give a specific diagnosis for the cause of chest pain. There is always a chance that your pain could be related to something serious, such as a heart attack or a blood clot in the lungs. You need to follow up with your health care provider for further evaluation. CAUSES   Heartburn.  Pneumonia or bronchitis.  Anxiety or stress.  Inflammation around your heart (pericarditis) or lung (pleuritis or pleurisy).  A blood clot in the lung.  A collapsed lung (pneumothorax). It can develop suddenly on its own (spontaneous pneumothorax) or from trauma to the chest.  Shingles infection (herpes zoster virus). The chest wall is composed of bones, muscles, and cartilage. Any of these can be the source of the pain.  The bones can be bruised by injury.  The muscles or cartilage can be strained by coughing or overwork.  The cartilage can be affected by inflammation and become sore (costochondritis). DIAGNOSIS  Lab tests or other studies may be needed to find the cause of your pain. Your health care provider may have you take a test called an ambulatory electrocardiogram (ECG). An ECG records your heartbeat patterns over a 24-hour period. You may also have other tests, such as:  Transthoracic echocardiogram (TTE). During echocardiography, sound waves are used to evaluate how blood flows through your heart.  Transesophageal echocardiogram (TEE).  Cardiac monitoring. This allows your health care provider to monitor your heart rate and rhythm in real time.  Holter monitor. This is a portable device that records your heartbeat and can help diagnose heart arrhythmias. It allows your health care provider to track your heart activity for several days, if needed.  Stress tests by exercise or by giving medicine that makes the heart beat  faster. TREATMENT   Treatment depends on what may be causing your chest pain. Treatment may include:  Acid blockers for heartburn.  Anti-inflammatory medicine.  Pain medicine for inflammatory conditions.  Antibiotics if an infection is present.  You may be advised to change lifestyle habits. This includes stopping smoking and avoiding alcohol, caffeine, and chocolate.  You may be advised to keep your head raised (elevated) when sleeping. This reduces the chance of acid going backward from your stomach into your esophagus. Most of the time, nonspecific chest pain will improve within 2-3 days with rest and mild pain medicine.  HOME CARE INSTRUCTIONS   If antibiotics were prescribed, take them as directed. Finish them even if you start to feel better.  For the next few days, avoid physical activities that bring on chest pain. Continue physical activities as directed.  Do not use any tobacco products, including cigarettes, chewing tobacco, or electronic cigarettes.  Avoid drinking alcohol.  Only take medicine as directed by your health care provider.  Follow your health care provider's suggestions for further testing if your chest pain does not go away.  Keep any follow-up appointments you made. If you do not go to an appointment, you could develop lasting (chronic) problems with pain. If there is any problem keeping an appointment, call to reschedule. SEEK MEDICAL CARE IF:   Your chest pain does not go away, even after treatment.  You have a rash with blisters on your chest.  You have a fever. SEEK IMMEDIATE MEDICAL CARE IF:   You have increased chest pain or pain that spreads to your arm, neck,  jaw, back, or abdomen.  You have shortness of breath.  You have an increasing cough, or you cough up blood.  You have severe back or abdominal pain.  You feel nauseous or vomit.  You have severe weakness.  You faint.  You have chills. This is an emergency. Do not wait to  see if the pain will go away. Get medical help at once. Call your local emergency services (911 in [place] Do not drive yourself to the hospital. MAKE SURE YOU:   Understand these instructions.  Will watch your condition.  Will get help right away if you are not doing well or get worse. Document Released: 10/01/2004 Document Revised: 12/27/2012 Document Reviewed: 07/28/2007 [facility] Patient Information 2015 [place], [place]. This information is not intended to replace advice given to you by your health care provider. Make sure you discuss any questions you have with your health care provider.

## 2014-04-07 NOTE — ED Notes (Signed)
Pt brought in via [facility] from [facility]. Pt complaining of anterior substernal pain that radiates toward her left breast. Pt is A/O. Pt denies any N/V/D. Pt is currently chest pain free but does report some abdominal pain for 2 months. EMS V/S BP 110/64, SPO2 92, HR 72.

## 2014-04-07 NOTE — Telephone Encounter (Signed)
I received a call on call from [facility].  Nurse indicates that Ms. Karan has been c/o of chest pain on inhalation for the past week.  She has a h/o Parkinson's disease, RA with chronic pain, CKDII, osteoporosis, major depression, GERD, htn, hyperlipidemia.   He notes she is extremely diaphoretic, hypotensive for her (94/65), very pale.  She has not been eating well.  Her other VS:  HR 75, RR 18, T 97.9, POX 94%RA (was obtained upon request).  He is concerned about PE with her pleuritic pain.  The low bp will not tolerate a ntg tablet.    Reviewing notes indicates her code status has been discussed and is DNR.  She is quite frail and debilitated.  There is no mention of a MOST form. She was seen by Dr. Eden Emms for precordial pain, but had normal VS at that time, not pale or diaphoretic.  She was hypotensive the time she went to the ED for chest pain previously on 1/31.  Due to nursing assessment indicating acute illness, possibly MI, advised nurse to send resident to ED for further assessment if family agrees.  Recommend MOST be completed to help guide further care.

## 2014-04-14 ENCOUNTER — Encounter: Payer: Self-pay | Admitting: Internal Medicine

## 2014-04-14 DIAGNOSIS — M5432 Sciatica, left side: Secondary | ICD-10-CM | POA: Insufficient documentation

## 2014-04-14 NOTE — Assessment & Plan Note (Signed)
Stable weights without signs or sx of aspiration;currently on restorative for eating/swallowing; stable weights;Plan-continue current tx  And monitoring

## 2014-04-14 NOTE — Assessment & Plan Note (Signed)
Pt had been without sx but sx have started;visit to ED with neg cardiac w/u; will start protonix 40 mg BID for 4 weeks, then daily for at least 4 weeks, then monitor

## 2014-04-14 NOTE — Assessment & Plan Note (Signed)
No current signs or sx of depression;Plan-continue cymbalta 60 mg daily

## 2014-04-14 NOTE — Assessment & Plan Note (Signed)
Due to elm trees with recent flare;Plan - change claritin to 10 mg scheduled during the season

## 2014-04-14 NOTE — Assessment & Plan Note (Signed)
Earlier in month pt had flare, treated with 5 days prednisone, appears to be improving.

## 2014-05-28 ENCOUNTER — Non-Acute Institutional Stay (SKILLED_NURSING_FACILITY): Payer: Medicare Other | Admitting: Internal Medicine

## 2014-05-28 ENCOUNTER — Encounter: Payer: Self-pay | Admitting: Internal Medicine

## 2014-05-28 DIAGNOSIS — G20 Parkinson's disease: Secondary | ICD-10-CM | POA: Diagnosis not present

## 2014-05-28 DIAGNOSIS — M069 Rheumatoid arthritis, unspecified: Secondary | ICD-10-CM

## 2014-05-28 DIAGNOSIS — K219 Gastro-esophageal reflux disease without esophagitis: Secondary | ICD-10-CM | POA: Diagnosis not present

## 2014-05-28 NOTE — Assessment & Plan Note (Signed)
Ongoing muscle regidity and intermiddent hallucinations, going on for about a year; Dx managed by Dr Quentin Mulling, neurology;Plan- cont stalevo, mirapex and apokyn prn

## 2014-05-28 NOTE — Progress Notes (Signed)
MRN: [id number] Name: Sheri Molina  Sex: female Age: 81 y.o. DOB: 1929/08/05  [email or web address] Facility/Room:218A Level Of Care: SNF Provider: Merrilee Seashore D Emergency Contacts: Extended Emergency Contact Information Primary Emergency Contact: Casados,Joel Address: [address] [place], [place] [address] [place] Home Phone: [phone] Relation: Son  Code Status: DNR  Allergies: Ampicillin  Chief Complaint  Patient presents with  . Medical Management of Chronic Issues    HPI: Patient is 81 y.o. female who is being seen for routine issues.  Past Medical History  Diagnosis Date  . Localized superficial swelling, mass, or lump   . Paralysis agitans   . Urinary tract infection, site not specified   . Allergic rhinitis, cause unspecified   . Diaphragmatic hernia without mention of obstruction or gangrene   . 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 Chronic kidney disease, unspecified   . Unspecified constipation   . Myalgia and myositis, unspecified   . Peripheral vascular disease, unspecified   . Senile osteoporosis   . GERD (gastroesophageal reflux disease)   . Major depressive disorder, recurrent episode, moderate   . Hypertension   . Hyperlipidemia   . Parkinson disease   . Degeneration of lumbar or lumbosacral intervertebral disc   . Rheumatoid arthritis(714.0)   . Generalized osteoarthrosis, involving multiple sites     History reviewed. No pertinent past surgical history.    Medication List       This list is accurate as of: 05/28/14  8:34 PM.  Always use your most recent med list.               albuterol (2.5 MG/3ML) 0.083% nebulizer solution  Commonly known as:  PROVENTIL  Take 2.5 mg by nebulization every 8 (eight) hours as needed for wheezing or shortness of breath.     fentaNYL 25 MCG/HR patch  Commonly  known as:  DURAGESIC - dosed mcg/hr  Apply one patch topically every 72 hours. Remove old patch before placement. Rotate sites     HYDROmorphone 4 MG tablet  Commonly known as:  DILAUDID  Take one tablet by mouth every 8 hours as needed for pain. One tablet per shift.     MOVE FREE PO  Take 1 tablet by mouth 3 (three) times daily.     nitroGLYCERIN 0.4 MG SL tablet  Commonly known as:  NITROSTAT  Place 0.4 mg under the tongue every 5 (five) minutes as needed for chest pain.     NON FORMULARY  Take 120 mLs by mouth 2 (two) times daily. Med Pass     NON FORMULARY  Take 1 tablet by mouth 3 (three) times daily. Move Free Advanced   500/67/500mg      Oyster Shell 500 MG Tabs  Take 500 mg by mouth 2 (two) times daily.     Polyethyl Glycol-Propyl Glycol 0.4-0.3 % Soln  Apply 1 drop to eye 3 (three) times daily.     sennosides-docusate sodium 8.6-50 MG tablet  Commonly known as:  SENOKOT-S  Take 1 tablet by mouth 2 (two) times daily.     sodium chloride 0.65 % Soln nasal spray  Commonly known as:  OCEAN  Place 2 sprays into both nostrils 2 (two) times daily.        No orders of the defined types were placed in this encounter.  There is no immunization history on file for this patient.  History  Substance Use Topics  . Smoking status: Never Smoker   . Smokeless tobacco: Not on file  . Alcohol Use: No    Review of Systems  DATA OBTAINED: from patient, nurse, medical record GENERAL:  no fevers SKIN: No itching, rash HEENT: No complaint RESPIRATORY: No cough, wheezing, SOB CARDIAC: No chest pain, palpitations, lower extremity edema  GI: No abdominal pain, No N/V/D or constipation, No heartburn or reflux  GU: No dysuria, frequency or urgency, or incontinence  MUSCULOSKELETAL: chronic pain , managable; muscle stiffness NEUROLOGIC: No headache, dizziness  PSYCHIATRIC: No overt anxiety or sadness  Filed Vitals:   05/28/14 1722  BP: 138/76  Pulse: 58  Temp: 98 F  (36.7 C)  Resp: 19    Physical Exam  GENERAL APPEARANCE: Alert, conversant, No acute distress  SKIN: No diaphoresis rash HEENT: Unremarkable RESPIRATORY: Breathing is even, unlabored CARDIOVASCULAR:  No peripheral edema  GASTROINTESTINAL: Abdomen is  not distended  GENITOURINARY: Bladder not distended  MUSCULOSKELETAL:deformities of spine and extremities NEUROLOGIC: Cranial nerves 2-12 grossly intact. PSYCHIATRIC: Mood and affect appropriate to situation, no behavioral issues  Patient Active Problem List   Diagnosis Date Noted  . Sciatica of left side 04/14/2014  . Peripheral neuropathy 02/26/2014  . Chronic pain syndrome 01/24/2014  . Dysphagia, oral phase 10/08/2013  . GERD (gastroesophageal reflux disease)   . Hypertension   . Hyperlipidemia   . Localized superficial swelling, mass, or lump   . Paralysis agitans   . Urinary tract infection, site not specified   . Allergic rhinitis   . Major depressive disorder, recurrent episode, moderate   . Diaphragmatic hernia without mention of obstruction or gangrene   . 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 Chronic kidney disease, stage 2, mildly decreased GFR   . Unspecified constipation   . Myalgia and myositis, unspecified   . Peripheral vascular disease, unspecified   . Degeneration of lumbar or lumbosacral intervertebral disc   . Rheumatoid arthritis involving multiple joints   . Senile osteoporosis   . Generalized osteoarthrosis, involving multiple sites     CBC    Component Value Date/Time   WBC 6.6 04/07/2014 1506   RBC 3.97 04/07/2014 1506   HGB 12.3 04/07/2014 1506   HCT 37.2 04/07/2014 1506   PLT 136* 04/07/2014 1506   MCV 93.7 04/07/2014 1506   LYMPHSABS 1.0 04/07/2014 1506   MONOABS 0.5 04/07/2014 1506   EOSABS 0.0 04/07/2014 1506   BASOSABS 0.0 04/07/2014 1506    CMP     Component Value  Date/Time   NA 139 04/07/2014 1506   K 3.7 04/07/2014 1506   CL 103 04/07/2014 1506   CO2 26 04/07/2014 1506   GLUCOSE 136* 04/07/2014 1506   BUN 15 04/07/2014 1506   CREATININE 0.78 04/07/2014 1506   CALCIUM 9.0 04/07/2014 1506   PROT 5.9* 04/07/2014 1506   ALBUMIN 3.5 04/07/2014 1506   AST 34 04/07/2014 1506   ALT 6 04/07/2014 1506   ALKPHOS 51 04/07/2014 1506   BILITOT 0.3 04/07/2014 1506   GFRNONAA 76* 04/07/2014 1506   GFRAA 88* 04/07/2014 1506    Assessment and Plan  GERD (gastroesophageal reflux disease) Pt at protonix 40 mg daily now and without sx;on carafate QID also;per RP to see GI in June to follow   Rheumatoid arthritis involving multiple joints  Chronic and stable; not currently receiving DMARD tx per pt wishes; Plan - cont prednisone 5 mg daily and pain meds prn   Paralysis agitans Ongoing muscle regidity and intermiddent hallucinations, going on for about a year; Dx managed by Dr Quentin Mulling, neurology;Plan- cont stalevo, mirapex and apokyn prn     Margit Hanks, MD

## 2014-05-28 NOTE — Assessment & Plan Note (Signed)
Chronic and stable; not currently receiving DMARD tx per pt wishes; Plan - cont prednisone 5 mg daily and pain meds prn

## 2014-05-28 NOTE — Assessment & Plan Note (Signed)
Pt at protonix 40 mg daily now and without sx;on carafate QID also;per RP to see GI in June to follow

## 2014-06-21 ENCOUNTER — Encounter (INDEPENDENT_AMBULATORY_CARE_PROVIDER_SITE_OTHER): Payer: Self-pay

## 2014-06-21 ENCOUNTER — Encounter: Payer: Self-pay | Admitting: Internal Medicine

## 2014-06-21 ENCOUNTER — Ambulatory Visit (INDEPENDENT_AMBULATORY_CARE_PROVIDER_SITE_OTHER): Payer: Medicare Other | Admitting: Internal Medicine

## 2014-06-21 VITALS — BP 134/80 | HR 72

## 2014-06-21 DIAGNOSIS — M4120 Other idiopathic scoliosis, site unspecified: Secondary | ICD-10-CM

## 2014-06-21 DIAGNOSIS — M069 Rheumatoid arthritis, unspecified: Secondary | ICD-10-CM | POA: Diagnosis not present

## 2014-06-21 DIAGNOSIS — N390 Urinary tract infection, site not specified: Secondary | ICD-10-CM

## 2014-06-21 DIAGNOSIS — K5900 Constipation, unspecified: Secondary | ICD-10-CM | POA: Diagnosis not present

## 2014-06-21 DIAGNOSIS — R109 Unspecified abdominal pain: Secondary | ICD-10-CM | POA: Diagnosis not present

## 2014-06-21 NOTE — Progress Notes (Signed)
Patient ID: Sheri Molina, female   DOB: [birth year], 81 y.o.   MRN: [id number] HPI: Breniyah Romm is an 81 year old female with a past medical history of rheumatoid arthritis, Parkinson's disease, osteoporosis, chronic kidney disease, peripheral vascular disease, chronic constipation and GERD who seen in consultation at the request of Dr. Lyn Hollingshead to evaluate abdominal pain. She is here today with her son. She reports issues with lower abdominal pain worse on the left side. This is present in the morning when she wakes up starts is a "growling or rolling" and seems to worsen after eating. It is cramping and at other times burning in nature. Overall she reports a good appetite but also reports she has been losing weight. Her son feels like this is related to her Parkinson's disease and great trouble in feeding herself. She denies nausea or vomiting. She does report issues with constipation and at other times diarrhea or loose stools though constipation has definitely been predominant. She has multiple laxative listed on her MAR. She feels that MiraLAX works the best. She is currently taking MiraLAX once daily. She also appears to be taken Senokot twice daily and milk of magnesia every 3 days if no bowel movement. Milk of magnesia does work and results in usually large soft or loose bowel movement. She has been placed on twice daily PPI and Carafate 1 g 4 times daily. She does not feel that the Carafate has helped with her symptoms in any way. She's had trouble with very frequent UTIs and has been on near continuous antibiotics for several months. She was very recently started on Cefpodoxime. She also has had issues with hallucinations felt related to UTI or possibly antibiotics. Cultures have shown Proteus infection.  Of note her son feels that the abdominal pain is worse when she is bending over and due to severe scoliosis and Parkinson's disease she is often than over he says nearly at a right angle.  She denies  blood in her stool or melena. Bowel movement does not always seem to improve the pain.  Past Medical History  Diagnosis Date  . Localized superficial swelling, mass, or lump   . Paralysis agitans   . Urinary tract infection, site not specified   . Allergic rhinitis, cause unspecified   . Diaphragmatic hernia without mention of obstruction or gangrene   . 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 Chronic kidney disease, unspecified   . Unspecified constipation   . Myalgia and myositis, unspecified   . Peripheral vascular disease, unspecified   . Senile osteoporosis   . GERD (gastroesophageal reflux disease)   . Major depressive disorder, recurrent episode, moderate   . Hypertension   . Hyperlipidemia   . Parkinson disease   . Degeneration of lumbar or lumbosacral intervertebral disc   . Rheumatoid arthritis(714.0)   . Generalized osteoarthrosis, involving multiple sites     No past surgical history on file.  Outpatient Prescriptions Prior to Visit  Medication Sig Dispense Refill  . albuterol (PROVENTIL) (2.5 MG/3ML) 0.083% nebulizer solution Take 2.5 mg by nebulization every 8 (eight) hours as needed for wheezing or shortness of breath.    . fentaNYL (DURAGESIC - DOSED MCG/HR) 25 MCG/HR patch Apply one patch topically every 72 hours. Remove old patch before placement. Rotate sites 10 patch 0  . Glucosamine-Chondroitin (MOVE FREE PO) Take 1 tablet by mouth 3 (three) times daily.    Marland Kitchen  HYDROmorphone (DILAUDID) 4 MG tablet Take one tablet by mouth every 8 hours as needed for pain. One tablet per shift. 90 tablet 0  . nitroGLYCERIN (NITROSTAT) 0.4 MG SL tablet Place 0.4 mg under the tongue every 5 (five) minutes as needed for chest pain.    . NON FORMULARY Take 120 mLs by mouth 2 (two) times daily. Med Pass    . NON FORMULARY Take 1 tablet by mouth 3 (three) times daily. Move Free  Advanced   500/67/500mg     . Oyster Shell 500 MG TABS Take 500 mg by mouth 2 (two) times daily.    Bertram Gala Glycol-Propyl Glycol 0.4-0.3 % SOLN Apply 1 drop to eye 3 (three) times daily.    . sodium chloride (OCEAN) 0.65 % SOLN nasal spray Place 2 sprays into both nostrils 2 (two) times daily.    . sennosides-docusate sodium (SENOKOT-S) 8.6-50 MG tablet Take 1 tablet by mouth 2 (two) times daily.     No facility-administered medications prior to visit.    Allergies  Allergen Reactions  . Ampicillin Other (See Comments)    MAR    No family history on file.  History  Substance Use Topics  . Smoking status: Never Smoker   . Smokeless tobacco: Not on file  . Alcohol Use: No    ROS: As per history of present illness, otherwise negative  BP 134/80 mmHg  Pulse 72  Ht   Wt  Constitutional: Chronically ill-appearing elderly female in wheel chair  HEENT: Normocephalic and atraumatic. Oropharynx is clear and moist. No oropharyngeal exudate. Conjunctivae are normal.  No scleral icterus. Neck: Neck supple. Trachea midline. Cardiovascular: Normal rate, regular rhythm and intact distal pulses.  Pulmonary/chest: Effort normal and breath sounds normal. No wheezing, rales or rhonchi. Abdominal: Soft, tenderness in the left midabdomen without rebound or guarding, nondistended. Bowel sounds active throughout. There are no masses palpable.  Extremities: no clubbing, cyanosis, or edema, severe rheumatoid arthritis changes lateral hands Neurological: Alert and oriented to person place and time. Skin: Skin is warm and dry. No rashes noted. Psychiatric: Normal mood and affect. Behavior is normal.  RELEVANT LABS AND IMAGING: CBC    Component Value Date/Time   WBC 6.6 04/07/2014 1506   RBC 3.97 04/07/2014 1506   HGB 12.3 04/07/2014 1506   HCT 37.2 04/07/2014 1506   PLT 136* 04/07/2014 1506   MCV 93.7 04/07/2014 1506   MCH 31.0 04/07/2014 1506   MCHC 33.1 04/07/2014 1506   RDW 13.2  04/07/2014 1506   LYMPHSABS 1.0 04/07/2014 1506   MONOABS 0.5 04/07/2014 1506   EOSABS 0.0 04/07/2014 1506   BASOSABS 0.0 04/07/2014 1506    CMP     Component Value Date/Time   NA 139 04/07/2014 1506   K 3.7 04/07/2014 1506   CL 103 04/07/2014 1506   CO2 26 04/07/2014 1506   GLUCOSE 136* 04/07/2014 1506   BUN 15 04/07/2014 1506   CREATININE 0.78 04/07/2014 1506   CALCIUM 9.0 04/07/2014 1506   PROT 5.9* 04/07/2014 1506   ALBUMIN 3.5 04/07/2014 1506   AST 34 04/07/2014 1506   ALT 6 04/07/2014 1506   ALKPHOS 51 04/07/2014 1506   BILITOT 0.3 04/07/2014 1506   GFRNONAA 76* 04/07/2014 1506   GFRAA 88* 04/07/2014 1506   EXAM: CT ABDOMEN AND PELVIS WITHOUT CONTRAST   TECHNIQUE: Multidetector CT imaging of the abdomen and pelvis was performed following the standard protocol without IV contrast. Please note that due to technical difficulties, no reformatted  images could be generated.   COMPARISON:  None.   FINDINGS: Please note that parenchymal abnormalities may be missed without intravenous contrast.   Lower chest:  Mild bibasilar atelectasis/scarring is noted.   Hepatobiliary: The visualized liver is unremarkable. The gallbladder is distended. No definite biliary dilatation identified.   Pancreas: Unremarkable   Spleen: Unremarkable except for calcified granulomatous disease.   Adrenals/Urinary Tract: The kidneys, adrenal glands and bladder are unremarkable.   Stomach/Bowel: A moderate amount of stool within the colon and rectum noted. There is no evidence bowel obstruction or pneumoperitoneum.   Vascular/Lymphatic: No enlarged lymph nodes or abdominal aortic aneurysm.   Reproductive: Patient is status post hysterectomy. No adnexal masses identified.   Other: Is small amount of free fluid within the pelvis is noted.   Musculoskeletal: A moderate to severe lumbar scoliosis and degenerative changes identified. No acute bony abnormalities are noted.    IMPRESSION: Small amount of free pelvic fluid -nonspecific.   Distended gallbladder without other CT evidence of acute cholecystitis. Consider ultrasound as clinically indicated.   Moderate colonic and rectal stool.   Bibasilar atelectasis/scarring.     Electronically Signed   By: Harmon Pier M.D.   On: 04/02/2014 15:19  ASSESSMENT/PLAN: 81 year old female with a past medical history of rheumatoid arthritis, Parkinson's disease, osteoporosis, chronic kidney disease, peripheral vascular disease, chronic constipation and GERD who seen in consultation at the request of Dr. Lyn Hollingshead to evaluate abdominal pain.   1. Abd pain/constipation -- difficult to determine the exact etiology of her abdominal pain given her multiple medical comorbidities. CT scan from March has been reviewed and did not reveal any significant abnormalities other than moderate colonic and rectal stool burden indicative of constipation. Symptoms are not consistent with cholecystitis. Given her age and comorbidity I do not feel she is a candidate for endoscopy or colonoscopy at this time. This was discussed extensively with the patient and her son both who agree. It doesn't seem that she is benefited from Carafate and I will discontinue it. We'll have her continue omeprazole 40 mg once daily. It is possible the constipation is causing some of her discomfort. Senokot also can cause spasm which also may be uncomfortable. Discontinue Senokot, increase MiraLAX to 17 g twice a day. Can use milk of magnesia if no bowel movement after 3 days. --Some of her discomfort may be musculoskeletal given severe kyphosis, osteoarthritis and rheumatoid arthritis. It is also possible that persistent UTI/cystitis is causing some of her lower abdominal discomfort. She is currently on antibiotics --Follow-up in 8 weeks for reassessment given medication changes today --If worsening or changing symptoms consider repeat CT scan --There is no evidence  of elevated liver enzymes or anemia or leukocytosis  2. UTI -- on antibiotics managed by Dr. Lyn Hollingshead in [place], [place]  60 min, and greater than 50% of this time with the patient and her son coordinating care and reviewing records   Cc:Margit Hanks, Md [address] [place], [place] [address]

## 2014-06-21 NOTE — Patient Instructions (Addendum)
Please discontinue Carafate Please discontinue Sennokot  Please increase Miralax 17 gm to Twice a day  Continue taking omeprazole  If not bowel movement in 3 days please use Milk of Mag  Please follow up with Dr. Rhea Belton on 08-22-14 at 11:00

## 2014-08-22 ENCOUNTER — Ambulatory Visit: Payer: Medicare Other | Admitting: Internal Medicine

## 2014-11-15 NOTE — Progress Notes (Signed)
This encounter was created in error - please disregard.

## 2014-12-03 ENCOUNTER — Non-Acute Institutional Stay (SKILLED_NURSING_FACILITY): Payer: Medicare Other | Admitting: Internal Medicine

## 2014-12-03 ENCOUNTER — Encounter: Payer: Self-pay | Admitting: Internal Medicine

## 2014-12-03 DIAGNOSIS — G894 Chronic pain syndrome: Secondary | ICD-10-CM | POA: Diagnosis not present

## 2014-12-03 DIAGNOSIS — G629 Polyneuropathy, unspecified: Secondary | ICD-10-CM

## 2014-12-03 DIAGNOSIS — I10 Essential (primary) hypertension: Secondary | ICD-10-CM | POA: Diagnosis not present

## 2014-12-03 DIAGNOSIS — F331 Major depressive disorder, recurrent, moderate: Secondary | ICD-10-CM

## 2014-12-03 DIAGNOSIS — K649 Unspecified hemorrhoids: Secondary | ICD-10-CM | POA: Diagnosis not present

## 2014-12-03 DIAGNOSIS — M069 Rheumatoid arthritis, unspecified: Secondary | ICD-10-CM | POA: Diagnosis not present

## 2014-12-03 NOTE — Progress Notes (Signed)
Patient ID: Sheri Molina, female   DOB: Mar 03, 1929, 81 y.o.   MRN: [id number]    DATE: 12/03/14  Location:  [facility]    Place of Service: SNF (31)   Extended Emergency Contact Information Primary Emergency Contact: Sedgwick,Joel Address: [place], [place] [address] [place] Phone: [phone] Relation: Son  Scientist, physiological information  DNR  Chief Complaint  Patient presents with  . Medical Management of Chronic Issues    HPI:  81 yo female long term resident seen today for f/u. She c/o hemorrhoid discomfort and is not scheduled to see specialist until January 2017. No obvious bleeding.  Chronic pain/FMS/RA/OA - right shoulder frozen and can only raise it about 30 degrees  HTN - stable on metoprolol. She has not req'd use of NTG for CP  Depression - stable on cymbalta  CKD - stable Cr  GERD - stable on prilosec, miralax  Tremor - on mirapex and sinemet  She takes vitamins, minerals and other supplements. she also has nutritional supplements and eye gtts  Past Medical History  Diagnosis Date  . Localized superficial swelling, mass, or lump   . Paralysis agitans ([place])   . Urinary tract infection, site not specified   . Allergic rhinitis, cause unspecified   . Diaphragmatic hernia without mention of obstruction or gangrene   . 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 Chronic kidney disease, unspecified ([place])   . Unspecified constipation   . Myalgia and myositis, unspecified   . Peripheral vascular disease, unspecified ([place])   . Senile osteoporosis   . GERD (gastroesophageal reflux disease)   . Major depressive disorder, recurrent episode, moderate ([place])   . Hypertension   . Hyperlipidemia   . Parkinson disease ([place])   . Degeneration of lumbar or lumbosacral intervertebral disc   . Rheumatoid  arthritis(714.0)   . Generalized osteoarthrosis, involving multiple sites     No past surgical history on file.  Patient Care Team: Hennie Duos, MD as PCP - General (Internal Medicine)  Social History   Social History  . Marital Status: Unknown    Spouse Name: N/A  . Number of Children: N/A  . Years of Education: N/A   Occupational History  . Not on file.   Social History Main Topics  . Smoking status: Never Smoker   . Smokeless tobacco: Not on file  . Alcohol Use: No  . Drug Use: No  . Sexual Activity: Not on file   Other Topics Concern  . Not on file   Social History Narrative     reports that she has never smoked. She does not have any smokeless tobacco history on file. She reports that she does not drink alcohol or use illicit drugs.   There is no immunization history on file for this patient.  Allergies  Allergen Reactions  . Ampicillin Other (See Comments)    MAR    Medications: Patient's Medications  New Prescriptions   No medications on file  Previous Medications   ALBUTEROL (PROVENTIL) (2.5 MG/3ML) 0.083% NEBULIZER SOLUTION    Take 2.5 mg by nebulization every 8 (eight) hours as needed for wheezing or shortness of breath.   AMBULATORY NON FORMULARY MEDICATION    Magic Cup every day   AMBULATORY NON FORMULARY MEDICATION    Occusoft Baby eyelid  towelette 1 wipe every night   APOMORPHINE HYDROCHLORIDE (APOKYN) 10 MG/ML SOLN    Inject 0.2 mLs into the skin. Five times per day   BISACODYL (DULCOLAX) 10 MG SUPPOSITORY    Place 10 mg rectally as needed for moderate constipation.   CARBIDOPA-LEVODOPA-ENTACAPONE (STALEVO 50 PO)    Take 1 tablet by mouth 3 (three) times daily.   CETIRIZINE (ZYRTEC) 10 MG TABLET    Take 10 mg by mouth daily.   CHOLECALCIFEROL (VITAMIN D3) 50000 UNITS TABS    Take 1 tablet by mouth daily.   DULOXETINE (CYMBALTA) 60 MG CAPSULE    Take 60 mg by mouth daily.   FENTANYL (DURAGESIC - DOSED MCG/HR) 25 MCG/HR PATCH    Apply one  patch topically every 72 hours. Remove old patch before placement. Rotate sites   FOLIC ACID (FOLVITE) 1 MG TABLET    Take 1 mg by mouth daily.   GLUCOSAMINE-CHONDROITIN (MOVE FREE PO)    Take 1 tablet by mouth 3 (three) times daily.   HYDROMORPHONE (DILAUDID) 4 MG TABLET    Take one tablet by mouth every 8 hours as needed for pain. One tablet per shift.   LINIMENTS (SALONPAS PAIN RELIEF PATCH EX)    Apply topically as needed.   MAGNESIUM HYDROXIDE (MILK OF MAGNESIA PO)    Take 30 mLs by mouth as needed.   METOPROLOL SUCCINATE (TOPROL-XL) 25 MG 24 HR TABLET    Take 25 mg by mouth daily.   NITROGLYCERIN (NITROSTAT) 0.4 MG SL TABLET    Place 0.4 mg under the tongue every 5 (five) minutes as needed for chest pain.   NON FORMULARY    Take 120 mLs by mouth 2 (two) times daily. Med Pass   NON FORMULARY    Take 1 tablet by mouth 3 (three) times daily. Move Free Advanced   500/67/565m   OMEGA-3 FATTY ACIDS (FISH OIL) 1000 MG CAPS    Take 1 capsule by mouth daily.   OMEPRAZOLE (PRILOSEC) 40 MG CAPSULE    Take 40 mg by mouth daily.   OYSTER SHELL 500 MG TABS    Take 500 mg by mouth 2 (two) times daily.   POLYETHYL GLYCOL-PROPYL GLYCOL 0.4-0.3 % SOLN    Apply 1 drop to eye 3 (three) times daily.   POLYETHYLENE GLYCOL POWDER (GLYCOLAX/MIRALAX) POWDER    Take 17 g by mouth 2 (two) times daily.   PRAMIPEXOLE (MIRAPEX) 1 MG TABLET    Take 1 mg by mouth 3 (three) times daily.   PREDNISOLONE 5 MG TABS TABLET    Take 5 mg by mouth daily.   SODIUM CHLORIDE (OCEAN) 0.65 % SOLN NASAL SPRAY    Place 2 sprays into both nostrils 2 (two) times daily.  Modified Medications   No medications on file  Discontinued Medications   No medications on file    Review of Systems  Unable to perform ROS: Psychiatric disorder    Filed Vitals:   12/03/14 2227  BP: 121/64  Pulse: 52  Temp: 97.8 F (36.6 C)  Weight: 108 lb 12.8 oz (49.351 kg)   Body mass index is 18.11 kg/(m^2).  Physical Exam  Constitutional: She  appears well-developed.  Frail appearing sitting in w/c i nNAD  HENT:  Mouth/Throat: Oropharynx is clear and moist. No oropharyngeal exudate.  Eyes: Pupils are equal, round, and reactive to light. No scleral icterus.  Neck: Neck supple. Carotid bruit is not present. No tracheal deviation present.  Cardiovascular: Normal rate, regular rhythm and  intact distal pulses.  Exam reveals no gallop and no friction rub.   Murmur (1/6 SEM) heard. No LE edema b/l. no calf TTP.   Pulmonary/Chest: Effort normal and breath sounds normal. No stridor. No respiratory distress. She has no wheezes. She has no rales.  Abdominal: Soft. Bowel sounds are normal. She exhibits no distension and no mass. There is no hepatomegaly. There is tenderness (general ). There is no rebound and no guarding.  Musculoskeletal: She exhibits edema and tenderness.  Small, intermdte and large joint deformities  Lymphadenopathy:    She has no cervical adenopathy.  Neurological: She is alert.  Skin: Skin is warm and dry. No rash noted.  Psychiatric: She has a normal mood and affect. Her behavior is normal.     Labs reviewed: No visits with results within 3 Month(s) from this visit. Latest known visit with results is:  Admission on 04/07/2014, Discharged on 04/07/2014  Component Date Value Ref Range Status  . Troponin i, poc 04/07/2014 0.01  0.00 - 0.08 ng/mL Final  . Comment 3 04/07/2014          Final   Comment: Due to the release kinetics of cTnI, a negative result within the first hours of the onset of symptoms does not rule out myocardial infarction with certainty. If myocardial infarction is still suspected, repeat the test at appropriate intervals.   . Sodium 04/07/2014 139  135 - 145 mmol/L Final  . Potassium 04/07/2014 3.7  3.5 - 5.1 mmol/L Final  . Chloride 04/07/2014 103  96 - 112 mmol/L Final  . CO2 04/07/2014 26  19 - 32 mmol/L Final  . Glucose, Bld 04/07/2014 136* 70 - 99 mg/dL Final  . BUN 04/07/2014 15  6  - 23 mg/dL Final  . Creatinine, Ser 04/07/2014 0.78  0.50 - 1.10 mg/dL Final  . Calcium 04/07/2014 9.0  8.4 - 10.5 mg/dL Final  . Total Protein 04/07/2014 5.9* 6.0 - 8.3 g/dL Final  . Albumin 04/07/2014 3.5  3.5 - 5.2 g/dL Final  . AST 04/07/2014 34  0 - 37 U/L Final  . ALT 04/07/2014 6  0 - 35 U/L Final  . Alkaline Phosphatase 04/07/2014 51  39 - 117 U/L Final  . Total Bilirubin 04/07/2014 0.3  0.3 - 1.2 mg/dL Final  . GFR calc non Af Amer 04/07/2014 76* >90 mL/min Final  . GFR calc Af Amer 04/07/2014 88* >90 mL/min Final   Comment: (NOTE) The eGFR has been calculated using the CKD EPI equation. This calculation has not been validated in all clinical situations. eGFR's persistently <90 mL/min signify possible Chronic Kidney Disease.   . Anion gap 04/07/2014 10  5 - 15 Final  . WBC 04/07/2014 6.6  4.0 - 10.5 K/uL Final  . RBC 04/07/2014 3.97  3.87 - 5.11 MIL/uL Final  . Hemoglobin 04/07/2014 12.3  12.0 - 15.0 g/dL Final  . HCT 04/07/2014 37.2  36.0 - 46.0 % Final  . MCV 04/07/2014 93.7  78.0 - 100.0 fL Final  . MCH 04/07/2014 31.0  26.0 - 34.0 pg Final  . MCHC 04/07/2014 33.1  30.0 - 36.0 g/dL Final  . RDW 04/07/2014 13.2  11.5 - 15.5 % Final  . Platelets 04/07/2014 136* 150 - 400 K/uL Final  . Neutrophils Relative % 04/07/2014 78* 43 - 77 % Final  . Lymphocytes Relative 04/07/2014 15  12 - 46 % Final  . Monocytes Relative 04/07/2014 7  3 - 12 % Final  . Eosinophils Relative  04/07/2014 0  0 - 5 % Final  . Basophils Relative 04/07/2014 0  0 - 1 % Final  . Neutro Abs 04/07/2014 5.1  1.7 - 7.7 K/uL Final  . Lymphs Abs 04/07/2014 1.0  0.7 - 4.0 K/uL Final  . Monocytes Absolute 04/07/2014 0.5  0.1 - 1.0 K/uL Final  . Eosinophils Absolute 04/07/2014 0.0  0.0 - 0.7 K/uL Final  . Basophils Absolute 04/07/2014 0.0  0.0 - 0.1 K/uL Final  . Smear Review 04/07/2014 MORPHOLOGY UNREMARKABLE   Final  . Lipase 04/07/2014 23  11 - 59 U/L Final  . D-Dimer, Quant 04/07/2014 0.61* 0.00 - 0.48  ug/mL-FEU Final   Comment:        AT THE INHOUSE ESTABLISHED CUTOFF VALUE OF 0.48 ug/mL FEU, THIS ASSAY HAS BEEN DOCUMENTED IN THE LITERATURE TO HAVE A SENSITIVITY AND NEGATIVE PREDICTIVE VALUE OF AT LEAST 98 TO 99%.  THE TEST RESULT SHOULD BE CORRELATED WITH AN ASSESSMENT OF THE CLINICAL PROBABILITY OF DVT / VTE.   Marland Kitchen Troponin i, poc 04/07/2014 0.00  0.00 - 0.08 ng/mL Final  . Comment 3 04/07/2014          Final   Comment: Due to the release kinetics of cTnI, a negative result within the first hours of the onset of symptoms does not rule out myocardial infarction with certainty. If myocardial infarction is still suspected, repeat the test at appropriate intervals.     No results found.   Assessment/Plan   ICD-9-CM ICD-10-CM   1. Hemorrhoids, unspecified hemorrhoid type 455.6 K64.9   2. Chronic pain syndrome - controlled 338.4 G89.4   3. Major depressive disorder, recurrent episode, moderate (HCC) - controlled 296.32 F33.1   4. Rheumatoid arthritis involving multiple joints (HCC) - stable 714.0 M06.9   5. Peripheral polyneuropathy (HCC) - stable 356.9 G62.9   6. Essential hypertension - stable 401.9 I10     Follow up with GI as scheduled for hemorrhoids  Cont current meds as ordered  PT/OT as indicated  Will follow  Briston Lax S. Perlie Gold  [facility] [address] [place], [place] [address] [phone] Cell (Monday-Friday 8 AM - 5 PM) [phone] After 5 PM and follow prompts

## 2014-12-24 ENCOUNTER — Non-Acute Institutional Stay (SKILLED_NURSING_FACILITY): Payer: Medicare Other | Admitting: Internal Medicine

## 2014-12-24 ENCOUNTER — Encounter: Payer: Self-pay | Admitting: Internal Medicine

## 2014-12-24 DIAGNOSIS — K219 Gastro-esophageal reflux disease without esophagitis: Secondary | ICD-10-CM

## 2014-12-24 DIAGNOSIS — G894 Chronic pain syndrome: Secondary | ICD-10-CM | POA: Diagnosis not present

## 2014-12-24 DIAGNOSIS — G20A1 Parkinson's disease without dyskinesia, without mention of fluctuations: Secondary | ICD-10-CM

## 2014-12-24 DIAGNOSIS — I10 Essential (primary) hypertension: Secondary | ICD-10-CM | POA: Diagnosis not present

## 2014-12-24 DIAGNOSIS — G20 Parkinson's disease: Secondary | ICD-10-CM | POA: Diagnosis not present

## 2014-12-24 DIAGNOSIS — G629 Polyneuropathy, unspecified: Secondary | ICD-10-CM

## 2014-12-24 DIAGNOSIS — H9193 Unspecified hearing loss, bilateral: Secondary | ICD-10-CM

## 2014-12-24 DIAGNOSIS — K649 Unspecified hemorrhoids: Secondary | ICD-10-CM | POA: Diagnosis not present

## 2014-12-24 DIAGNOSIS — M069 Rheumatoid arthritis, unspecified: Secondary | ICD-10-CM

## 2014-12-24 DIAGNOSIS — F331 Major depressive disorder, recurrent, moderate: Secondary | ICD-10-CM | POA: Diagnosis not present

## 2014-12-24 NOTE — Progress Notes (Signed)
Patient ID: Sheri Molina, female   DOB: 03-21-1929, 81 y.o.   MRN: [id number]    DATE: 12/24/14  Location:  [facility]    Place of Service: SNF (31)   Extended Emergency Contact Information Primary Emergency Contact: Cafarella,Joel Address: [place], [place] [address] [place] Phone: [phone] Relation: Son  Advanced Directive information Does patient have an advance directive?: Yes (Patient has a DNR order)  Chief Complaint  Patient presents with  . Medical Management of Chronic Issues    HPI:  81  Yo female long term resident seen today for f/u. She continues to have hemorrhoid discomfort and believes she has an appt in Jan to see GI. No bleeding. She is also c/a increased cerumen due to reduced hearing b/l. She drools at night and believes stones form in ear canal from saliva. She is c/a wt loss and states that her appetite is excellent. She has chronic joint pain and is confined to w/c due to it  Chronic pain/FMS/RA/OA - right shoulder frozen and can only raise it about 30 degrees. She has neuropathy. Pain stable with dilaudid and fentanyl  HTN - stable on metoprolol. She has not req'd use of NTG for CP  Depression - stable on cymbalta  CKD - stable Cr  GERD - stable on prilosec, miralax  Tremor - on mirapex and sinemet  She takes vitamins, minerals and other supplements. she also has nutritional supplements  Past Medical History  Diagnosis Date  . Localized superficial swelling, mass, or lump   . Paralysis agitans ([place])   . Urinary tract infection, site not specified   . Allergic rhinitis, cause unspecified   . Diaphragmatic hernia without mention of obstruction or gangrene   . 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 Chronic kidney disease, unspecified ([place])   . Unspecified constipation   .  Myalgia and myositis, unspecified   . Peripheral vascular disease, unspecified ([place])   . Senile osteoporosis   . GERD (gastroesophageal reflux disease)   . Major depressive disorder, recurrent episode, moderate ([place])   . Hypertension   . Hyperlipidemia   . Parkinson disease ([place])   . Degeneration of lumbar or lumbosacral intervertebral disc   . Rheumatoid arthritis(714.0)   . Generalized osteoarthrosis, involving multiple sites     No past surgical history on file.  Patient Care Team: Hennie Duos, MD as PCP - General (Internal Medicine)  Social History   Social History  . Marital Status: Unknown    Spouse Name: N/A  . Number of Children: N/A  . Years of Education: N/A   Occupational History  . Not on file.   Social History Main Topics  . Smoking status: Never Smoker   . Smokeless tobacco: Not on file  . Alcohol Use: No  . Drug Use: No  . Sexual Activity: Not on file   Other Topics Concern  . Not on file   Social History Narrative     reports that she has never smoked. She does not have any smokeless tobacco history on file. She reports that she does not drink alcohol or use illicit drugs.   There is no immunization history on file for this patient.  Allergies  Allergen Reactions  . Ampicillin Other (See Comments)    MAR    Medications:  Patient's Medications  New Prescriptions   No medications on file  Previous Medications   ALBUTEROL (PROVENTIL) (2.5 MG/3ML) 0.083% NEBULIZER SOLUTION    Take 2.5 mg by nebulization every 8 (eight) hours as needed for wheezing or shortness of breath.   AMBULATORY NON FORMULARY MEDICATION    Magic Cup every day   AMBULATORY NON FORMULARY MEDICATION    Occusoft Baby eyelid towelette 1 wipe every night   APOMORPHINE HYDROCHLORIDE (APOKYN) 10 MG/ML SOLN    Inject 0.2 mLs into the skin. Five times per day   BISACODYL (DULCOLAX) 10 MG SUPPOSITORY    Place 10 mg rectally as needed for moderate constipation.    CARBIDOPA-LEVODOPA-ENTACAPONE (STALEVO 50 PO)    Take 1 tablet by mouth 3 (three) times daily.   CETIRIZINE (ZYRTEC) 10 MG TABLET    Take 10 mg by mouth daily.   CHOLECALCIFEROL (VITAMIN D3) 50000 UNITS TABS    Take 1 tablet by mouth daily.   DULOXETINE (CYMBALTA) 60 MG CAPSULE    Take 60 mg by mouth daily.   FENTANYL (DURAGESIC - DOSED MCG/HR) 25 MCG/HR PATCH    Apply one patch topically every 72 hours. Remove old patch before placement. Rotate sites   GLUCOSAMINE-CHONDROITIN (MOVE FREE PO)    Take 1 tablet by mouth 3 (three) times daily.   HYDROMORPHONE (DILAUDID) 4 MG TABLET    Take one tablet by mouth every 8 hours as needed for pain. One tablet per shift.   LINIMENTS (SALONPAS PAIN RELIEF PATCH EX)    Apply topically as needed.   MAGNESIUM HYDROXIDE (MILK OF MAGNESIA PO)    Take 30 mLs by mouth as needed.   METOPROLOL SUCCINATE (TOPROL-XL) 25 MG 24 HR TABLET    Take 25 mg by mouth daily.   NITROGLYCERIN (NITROSTAT) 0.4 MG SL TABLET    Place 0.4 mg under the tongue every 5 (five) minutes as needed for chest pain.   NON FORMULARY    Take 120 mLs by mouth 2 (two) times daily. Med Pass   NON FORMULARY    Take 1 tablet by mouth 3 (three) times daily. Move Free Advanced   500/67/548m   OMEGA-3 FATTY ACIDS (FISH OIL) 1000 MG CAPS    Take 1 capsule by mouth daily.   OMEPRAZOLE (PRILOSEC) 40 MG CAPSULE    Take 40 mg by mouth daily.   OYSTER SHELL 500 MG TABS    Take 500 mg by mouth 2 (two) times daily.   POLYETHYLENE GLYCOL POWDER (GLYCOLAX/MIRALAX) POWDER    Take 17 g by mouth 2 (two) times daily.   PRAMIPEXOLE (MIRAPEX) 1 MG TABLET    Take 1 mg by mouth 3 (three) times daily.   PREDNISOLONE 5 MG TABS TABLET    Take 5 mg by mouth daily.   PROPYLENE GLYCOL (SYSTANE BALANCE) 0.6 % SOLN    Apply to eye. Instill 1 drop OU TID for dry eyes   SENNOSIDES-DOCUSATE SODIUM (SENNA S PO)    Take by mouth. Take 2 tablet by mouth twice a day  for constipation   SODIUM CHLORIDE (OCEAN) 0.65 % SOLN NASAL SPRAY     Place 2 sprays into both nostrils 2 (two) times daily.  Modified Medications   No medications on file  Discontinued Medications   FOLIC ACID (FOLVITE) 1 MG TABLET    Take 1 mg by mouth daily.   POLYETHYL GLYCOL-PROPYL GLYCOL 0.4-0.3 % SOLN    Apply 1 drop to eye 3 (three) times daily.  Review of Systems  Unable to perform ROS: Psychiatric disorder    Filed Vitals:   12/24/14 1250  BP: 121/64  Pulse: 68  Temp: 98.1 F (36.7 C)  TempSrc: Oral  Resp: 20  Weight: 108 lb 12.8 oz (49.351 kg)   Body mass index is 18.11 kg/(m^2).  Physical Exam  Constitutional: She appears well-developed. No distress.  Frail appearing in NAD. Sitting in w/c  HENT:  Mouth/Throat: Oropharynx is clear and moist. No oropharyngeal exudate.  Eyes: Pupils are equal, round, and reactive to light. No scleral icterus.  Neck: Neck supple. Carotid bruit is not present. No tracheal deviation present.  Cardiovascular: Normal rate, regular rhythm, normal heart sounds and intact distal pulses.  Exam reveals no gallop and no friction rub.   No murmur heard. No LE edema b/l. no calf TTP.   Pulmonary/Chest: Effort normal and breath sounds normal. No stridor. No respiratory distress. She has no wheezes. She has no rales.  Abdominal: Soft. Bowel sounds are normal. She exhibits no distension and no mass. There is no hepatomegaly. There is no tenderness. There is no rebound and no guarding.  Musculoskeletal: She exhibits edema and tenderness.  Hand/finger contractures b/l. Small, intermdte and large joint deformities  Lymphadenopathy:    She has no cervical adenopathy.  Neurological: She is alert. She displays tremor. Gait abnormal.  (+) resting tremor with chorea  Skin: Skin is warm and dry. No rash noted.  Psychiatric: She has a normal mood and affect. Her behavior is normal.     Labs reviewed: No visits with results within 3 Month(s) from this visit. Latest known visit with results is:  Admission on  04/07/2014, Discharged on 04/07/2014  Component Date Value Ref Range Status  . Troponin i, poc 04/07/2014 0.01  0.00 - 0.08 ng/mL Final  . Comment 3 04/07/2014          Final   Comment: Due to the release kinetics of cTnI, a negative result within the first hours of the onset of symptoms does not rule out myocardial infarction with certainty. If myocardial infarction is still suspected, repeat the test at appropriate intervals.   . Sodium 04/07/2014 139  135 - 145 mmol/L Final  . Potassium 04/07/2014 3.7  3.5 - 5.1 mmol/L Final  . Chloride 04/07/2014 103  96 - 112 mmol/L Final  . CO2 04/07/2014 26  19 - 32 mmol/L Final  . Glucose, Bld 04/07/2014 136* 70 - 99 mg/dL Final  . BUN 04/07/2014 15  6 - 23 mg/dL Final  . Creatinine, Ser 04/07/2014 0.78  0.50 - 1.10 mg/dL Final  . Calcium 04/07/2014 9.0  8.4 - 10.5 mg/dL Final  . Total Protein 04/07/2014 5.9* 6.0 - 8.3 g/dL Final  . Albumin 04/07/2014 3.5  3.5 - 5.2 g/dL Final  . AST 04/07/2014 34  0 - 37 U/L Final  . ALT 04/07/2014 6  0 - 35 U/L Final  . Alkaline Phosphatase 04/07/2014 51  39 - 117 U/L Final  . Total Bilirubin 04/07/2014 0.3  0.3 - 1.2 mg/dL Final  . GFR calc non Af Amer 04/07/2014 76* >90 mL/min Final  . GFR calc Af Amer 04/07/2014 88* >90 mL/min Final   Comment: (NOTE) The eGFR has been calculated using the CKD EPI equation. This calculation has not been validated in all clinical situations. eGFR's persistently <90 mL/min signify possible Chronic Kidney Disease.   . Anion gap 04/07/2014 10  5 - 15 Final  . WBC 04/07/2014 6.6  4.0 -  10.5 K/uL Final  . RBC 04/07/2014 3.97  3.87 - 5.11 MIL/uL Final  . Hemoglobin 04/07/2014 12.3  12.0 - 15.0 g/dL Final  . HCT 04/07/2014 37.2  36.0 - 46.0 % Final  . MCV 04/07/2014 93.7  78.0 - 100.0 fL Final  . MCH 04/07/2014 31.0  26.0 - 34.0 pg Final  . MCHC 04/07/2014 33.1  30.0 - 36.0 g/dL Final  . RDW 04/07/2014 13.2  11.5 - 15.5 % Final  . Platelets 04/07/2014 136* 150 - 400 K/uL  Final  . Neutrophils Relative % 04/07/2014 78* 43 - 77 % Final  . Lymphocytes Relative 04/07/2014 15  12 - 46 % Final  . Monocytes Relative 04/07/2014 7  3 - 12 % Final  . Eosinophils Relative 04/07/2014 0  0 - 5 % Final  . Basophils Relative 04/07/2014 0  0 - 1 % Final  . Neutro Abs 04/07/2014 5.1  1.7 - 7.7 K/uL Final  . Lymphs Abs 04/07/2014 1.0  0.7 - 4.0 K/uL Final  . Monocytes Absolute 04/07/2014 0.5  0.1 - 1.0 K/uL Final  . Eosinophils Absolute 04/07/2014 0.0  0.0 - 0.7 K/uL Final  . Basophils Absolute 04/07/2014 0.0  0.0 - 0.1 K/uL Final  . Smear Review 04/07/2014 MORPHOLOGY UNREMARKABLE   Final  . Lipase 04/07/2014 23  11 - 59 U/L Final  . D-Dimer, Quant 04/07/2014 0.61* 0.00 - 0.48 ug/mL-FEU Final   Comment:        AT THE INHOUSE ESTABLISHED CUTOFF VALUE OF 0.48 ug/mL FEU, THIS ASSAY HAS BEEN DOCUMENTED IN THE LITERATURE TO HAVE A SENSITIVITY AND NEGATIVE PREDICTIVE VALUE OF AT LEAST 98 TO 99%.  THE TEST RESULT SHOULD BE CORRELATED WITH AN ASSESSMENT OF THE CLINICAL PROBABILITY OF DVT / VTE.   Marland Kitchen Troponin i, poc 04/07/2014 0.00  0.00 - 0.08 ng/mL Final  . Comment 3 04/07/2014          Final   Comment: Due to the release kinetics of cTnI, a negative result within the first hours of the onset of symptoms does not rule out myocardial infarction with certainty. If myocardial infarction is still suspected, repeat the test at appropriate intervals.     No results found.   Assessment/Plan   ICD-9-CM ICD-10-CM   1. Hemorrhoids, unspecified hemorrhoid type - uncomplicated at this time 455.6 K64.9   2. Hearing loss, bilateral- possib;y due to cerumen impaction vs nml aging 389.9 H91.93   3. Chronic pain syndrome - stable 338.4 G89.4   4. Rheumatoid arthritis involving multiple joints (HCC) - stable 714.0 M06.9   5. Major depressive disorder, recurrent episode, moderate (HCC) - stable 296.32 F33.1   6. Paralysis agitans ([place]) - stable 332.0 G20   7. Essential  hypertension - controlled 401.9 I10   8. Peripheral polyneuropathy (HCC) - stable 356.9 G62.9   9. Gastroesophageal reflux disease without esophagitis - stable 530.81 K21.9   10. Parkinson's disease ([place]) - stable 332.0 G20     Refer to GI for hemorrhoid mx (pt request)  Debrox gtts 4 gtts in each ear BID x 7 days. If no respones, refer to ENT for cerumen bld up  Cont other meds as ordered  PT/OT/ST as indicated  Nutritional supplemts as indicated  Will follow   [place] S. Perlie Gold  [facility] [address] [place], [place] [address] [phone] Cell (Monday-Friday 8 AM - 5 PM) [phone] After 5 PM and follow prompts

## 2015-01-03 ENCOUNTER — Other Ambulatory Visit: Payer: Self-pay

## 2015-01-03 MED ORDER — HYDROMORPHONE HCL 4 MG PO TABS: ORAL_TABLET | ORAL | Status: DC

## 2015-01-03 NOTE — Telephone Encounter (Signed)
Faxed to [facility] Fax Number: [phone], Phone Number [phone]  

## 2015-01-29 ENCOUNTER — Ambulatory Visit (INDEPENDENT_AMBULATORY_CARE_PROVIDER_SITE_OTHER): Payer: Medicare Other | Admitting: Internal Medicine

## 2015-01-29 ENCOUNTER — Encounter: Payer: Self-pay | Admitting: Internal Medicine

## 2015-01-29 VITALS — BP 110/70 | HR 70

## 2015-01-29 DIAGNOSIS — K5900 Constipation, unspecified: Secondary | ICD-10-CM | POA: Diagnosis not present

## 2015-01-29 DIAGNOSIS — K648 Other hemorrhoids: Secondary | ICD-10-CM

## 2015-01-29 DIAGNOSIS — K641 Second degree hemorrhoids: Secondary | ICD-10-CM

## 2015-01-29 NOTE — Patient Instructions (Signed)
Please take miralax 17 grams (1 capful) in at least 8 ounces water/juice and drink once daily. If you are already on 1 capful daily, you will need to increase to 2 capfuls (34 grams) daily.  You have been scheduled for your 2nd hemorrhoidal banding with Dr Rhea Belton on Friday, 02/15/15 @ 4:00 pm.  HEMORRHOID BANDING PROCEDURE    FOLLOW-UP CARE   1. The procedure you have had should have been relatively painless since the banding of the area involved does not have nerve endings and there is no pain sensation.  The rubber band cuts off the blood supply to the hemorrhoid and the band may fall off as soon as 48 hours after the banding (the band may occasionally be seen in the toilet bowl following a bowel movement). You may notice a temporary feeling of fullness in the rectum which should respond adequately to plain Tylenol or Motrin.  2. Following the banding, avoid strenuous exercise that evening and resume full activity the next day.  A sitz bath (soaking in a warm tub) or bidet is soothing, and can be useful for cleansing the area after bowel movements.     3. To avoid constipation, take two tablespoons of natural wheat bran, natural oat bran, flax, Benefiber or any over the counter fiber supplement and increase your water intake to 7-8 glasses daily.    4. Unless you have been prescribed anorectal medication, do not put anything inside your rectum for two weeks: No suppositories, enemas, fingers, etc.  5. Occasionally, you may have more bleeding than usual after the banding procedure.  This is often from the untreated hemorrhoids rather than the treated one.  Don't be concerned if there is a tablespoon or so of blood.  If there is more blood than this, lie flat with your bottom higher than your head and apply an ice pack to the area. If the bleeding does not stop within a half an hour or if you feel faint, call our office at [phone] or go to the emergency room.  6. Problems are not  common; however, if there is a substantial amount of bleeding, severe pain, chills, fever or difficulty passing urine (very rare) or other problems, you should call us at [phone] or report to the nearest emergency room.  7. Do not stay seated continuously for more than 2-3 hours for a day or two after the procedure.  Tighten your buttock muscles 10-15 times every two hours and take 10-15 deep breaths every 1-2 hours.  Do not spend more than a few minutes on the toilet if you cannot empty your bowel; instead re-visit the toilet at a later time.

## 2015-01-29 NOTE — Progress Notes (Signed)
Subjective:    Patient ID: Sheri Molina, female    DOB: Sep 11, 1929, 82 y.o.   MRN: [id number]  HPI  Sheri Molina is an 82 year old female with past medical history of rheumatoid arthritis, Parkinson's, osteoporosis, chronic kidney disease, peripheral vascular disease, chronic constipation and GERD who is here for follow-up. She's here today with her son. Reportedly she's been having prolapsing tissue with defecation and feeling like "something is in my rectum". This was reportedly seen by a nurse practitioner or physician's assistant at her living facility and she was referred. She reports feeling the presence of prolapsing tissue when she strains for bowel movement. Previously constipation of been an issue but she reports this has not been an issue lately. She does not feel she is taking MiraLAX but it is listed on her medication record at 17 g each evening. Senokot was stopped due to diarrhea and is available as needed, milk of magnesia is also available as needed. She reports having a bowel movement usually once daily and denies seeing blood in her stool though she does report straining with stool. She reports abdominal "your going" but denies abdominal pain. Appetite fluctuates. She denies nausea or vomiting. The abdominal pain which she discussed with me in June she states overall is better.  Review of Systems as per history of present illness, otherwise negative  Current Medications, Allergies, Past Medical History, Past Surgical History, Family History and Social History were reviewed in [employer] record.      Objective:   Physical Exam BP 110/70 mmHg  Pulse 70  Ht   Wt  Constitutional: Well-developed and well-nourished. No distress. HEENT: Normocephalic and atraumatic. Oropharynx is clear and moist. No oropharyngeal exudate. Conjunctivae are normal.  No scleral icterus. Neck: Neck supple. Trachea midline. Cardiovascular: Normal rate, regular rhythm and intact  distal pulses. No M/R/G Pulmonary/chest: Effort normal and breath sounds normal. No wheezing, rales or rhonchi. Abdominal: Soft, thin, nontender, nondistended. Bowel sounds active throughout.  Rectal: no external masses or hemorrhoids, no prolapsed tissue seen.  Internal exam reveals hard round brown heme-negative stool in the rectal vault without masses palpable. stool does interfere with complete rectal exam  Anoscopy was performed with the patient in the left lateral decubitus position while a chaperone was present and revealed small to moderate internal hemorrhoids Extremities: no clubbing, cyanosis, or edema Skin: Skin is warm and dry.  Psychiatric: Normal mood and affect. Behavior is normal.     Assessment & Plan:   82 year old female with past medical history of rheumatoid arthritis, Parkinson's, osteoporosis, chronic kidney disease, peripheral vascular disease, chronic constipation and GERD who is here for follow-up  1. Chronic constipation/prolapsing internal hemorrhoids -- after a thorough discussion with patient and her son, her symptoms of prolapse are felt secondary to intermittently prolapsing internal hemorrhoids. This is likely providing the sensation she is feeling during defecation and straining. I do think given the hard stool present in the rectal vault that she needs to resume MiraLAX 17 g each evening and if she already is to increase to twice daily. We will contact her living facility to see if she is taking this daily currently.  --Decision made for hemorrhoidal banding today after informed consent obtained   PROCEDURE NOTE: The patient presents with symptomatic grade 2 internal hemorrhoids, requesting rubber band ligation of her hemorrhoidal disease.  All risks, benefits and alternative forms of therapy were described and informed consent was obtained.   The anorectum was pre-medicated with 0.125% NTG ointment  The decision was made to band the RA internal hemorrhoid, and  the [facility] System was used to perform band ligation without complication.  Digital anorectal examination was then performed to assure proper positioning of the band, and to adjust the banded tissue as required.  The patient was discharged home without pain or other issues.  Dietary and behavioral recommendations were given and along with follow-up instructions.     The following adjunctive treatments were recommended: --MiraLax as above  The patient will return in 2-6 weeks for  follow-up and possible additional banding as required. At follow-up repeat anoscopy recommended No complications were encountered and the patient tolerated the procedure well.

## 2015-02-15 ENCOUNTER — Encounter: Payer: Medicare Other | Admitting: Internal Medicine

## 2015-03-01 ENCOUNTER — Encounter: Payer: Self-pay | Admitting: Internal Medicine

## 2015-03-01 ENCOUNTER — Ambulatory Visit (INDEPENDENT_AMBULATORY_CARE_PROVIDER_SITE_OTHER): Payer: Medicare Other | Admitting: Internal Medicine

## 2015-03-01 VITALS — BP 100/60 | HR 75

## 2015-03-01 DIAGNOSIS — K648 Other hemorrhoids: Secondary | ICD-10-CM | POA: Diagnosis not present

## 2015-03-01 DIAGNOSIS — K641 Second degree hemorrhoids: Secondary | ICD-10-CM

## 2015-03-01 NOTE — Patient Instructions (Signed)
Please follow up with Dr Rhea Belton as needed.  Continue Miralax as needed.  HEMORRHOID BANDING PROCEDURE    FOLLOW-UP CARE   1. The procedure you have had should have been relatively painless since the banding of the area involved does not have nerve endings and there is no pain sensation.  The rubber band cuts off the blood supply to the hemorrhoid and the band may fall off as soon as 48 hours after the banding (the band may occasionally be seen in the toilet bowl following a bowel movement). You may notice a temporary feeling of fullness in the rectum which should respond adequately to plain Tylenol or Motrin.  2. Following the banding, avoid strenuous exercise that evening and resume full activity the next day.  A sitz bath (soaking in a warm tub) or bidet is soothing, and can be useful for cleansing the area after bowel movements.     3. To avoid constipation, take two tablespoons of natural wheat bran, natural oat bran, flax, Benefiber or any over the counter fiber supplement and increase your water intake to 7-8 glasses daily.    4. Unless you have been prescribed anorectal medication, do not put anything inside your rectum for two weeks: No suppositories, enemas, fingers, etc.  5. Occasionally, you may have more bleeding than usual after the banding procedure.  This is often from the untreated hemorrhoids rather than the treated one.  Don't be concerned if there is a tablespoon or so of blood.  If there is more blood than this, lie flat with your bottom higher than your head and apply an ice pack to the area. If the bleeding does not stop within a half an hour or if you feel faint, call our office at [phone] or go to the emergency room.  6. Problems are not common; however, if there is a substantial amount of bleeding, severe pain, chills, fever or difficulty passing urine (very rare) or other problems, you should call us at [phone] or report to the nearest emergency  room.  7. Do not stay seated continuously for more than 2-3 hours for a day or two after the procedure.  Tighten your buttock muscles 10-15 times every two hours and take 10-15 deep breaths every 1-2 hours.  Do not spend more than a few minutes on the toilet if you cannot empty your bowel; instead re-visit the toilet at a later time.

## 2015-03-01 NOTE — Progress Notes (Signed)
Patient ID: Sheri Molina, female   DOB: January 06, 1930, 82 y.o.   MRN: [id number] Patient returns today for consideration of repeat hemorrhoidal banding for symptomatic internal hemorrhoids. Initially seen for prolapsing hemorrhoids and had band ligation performed to the right anterior internal hemorrhoid column. She was also having constipation and advised to do MiraLAX 17 g every day rather than as needed  Today she reports that she has had no further issue with constipation. Bowel movements have become more regular. Prolapse does not seem to be as much of a problem to her now. No rectal bleeding.  PROCEDURE NOTE: The patient presents with symptomatic grade 2 internal hemorrhoids, requesting rubber band ligation of his/her hemorrhoidal disease.  All risks, benefits and alternative forms of therapy were described and informed consent was obtained.   The anorectum was pre-medicated with 0.125% nitroglycerin ointment The decision was made to band the LL internal hemorrhoid, and the [facility] System was used to perform band ligation without complication.  Digital anorectal examination was then performed to assure proper positioning of the band, and to adjust the banded tissue as required.  The patient was discharged home without pain or other issues.  Dietary and behavioral recommendations were given and along with follow-up instructions.     The following adjunctive treatments were recommended: Continue MiraLAX 17 g daily   The patient will return as needed if constipation and prolapsing internal hemorrhoids remain a problem after second banding performed today  No complications were encountered and the patient tolerated the procedure well.

## 2015-03-04 ENCOUNTER — Non-Acute Institutional Stay (SKILLED_NURSING_FACILITY): Payer: Medicare Other | Admitting: Internal Medicine

## 2015-03-04 ENCOUNTER — Encounter: Payer: Self-pay | Admitting: Internal Medicine

## 2015-03-04 DIAGNOSIS — K219 Gastro-esophageal reflux disease without esophagitis: Secondary | ICD-10-CM

## 2015-03-04 DIAGNOSIS — M069 Rheumatoid arthritis, unspecified: Secondary | ICD-10-CM

## 2015-03-04 DIAGNOSIS — G894 Chronic pain syndrome: Secondary | ICD-10-CM | POA: Diagnosis not present

## 2015-03-04 DIAGNOSIS — G20 Parkinson's disease: Secondary | ICD-10-CM | POA: Diagnosis not present

## 2015-03-04 DIAGNOSIS — F0280 Dementia in other diseases classified elsewhere without behavioral disturbance: Secondary | ICD-10-CM | POA: Diagnosis not present

## 2015-03-04 DIAGNOSIS — J301 Allergic rhinitis due to pollen: Secondary | ICD-10-CM | POA: Diagnosis not present

## 2015-03-04 DIAGNOSIS — F331 Major depressive disorder, recurrent, moderate: Secondary | ICD-10-CM | POA: Diagnosis not present

## 2015-03-04 DIAGNOSIS — G629 Polyneuropathy, unspecified: Secondary | ICD-10-CM

## 2015-03-04 DIAGNOSIS — I10 Essential (primary) hypertension: Secondary | ICD-10-CM | POA: Diagnosis not present

## 2015-03-04 DIAGNOSIS — N390 Urinary tract infection, site not specified: Secondary | ICD-10-CM | POA: Diagnosis not present

## 2015-03-04 NOTE — Progress Notes (Signed)
Patient ID: Sheri Molina, female   DOB: 1929/11/05, 82 y.o.   MRN: [id number]    DATE: 03/04/15  Location:  [facility]    Place of Service: SNF (31)   Extended Emergency Contact Information Primary Emergency Contact: Sibal,Joel Address: [address], [place] [address] [place] Phone: [phone] Relation: Son  Scientist, physiological information  DNR  Chief Complaint  Patient presents with  . Medical Management of Chronic Issues    [facility]    HPI:  82 yo female long term resident seen today for f/u. She c/o sinus congestion and rhinorrhea today. She also c/o feeling stiff despite taking several injections (apokyn) throughout the day for Parkinson's. She is a poor historian due to Parkinson's related dementia. Hx obtained form chart  Chronic pain/FMS/RA/OA - right shoulder frozen and can only raise it about 30 degrees. She has neuropathy. Pain stable with dilaudid and fentanyl  HTN - stable on metoprolol. She has not req'd use of NTG for CP  Depression - stable on cymbalta  CKD - stable Cr  GERD - stable on prilosec, miralax  Tremor/Parkinson's - on mirapex and sinemet as well as apokyn. She is slowly declining  Recurrent UTI - recent UA revealed >100K E coli but she is asymptomatic. Family declines aggressive tx of asymptomatic UTI. [facility] provider aware  She takes vitamins, minerals and other supplements. she also has nutritional supplements  Past Medical History  Diagnosis Date  . Localized superficial swelling, mass, or lump   . Paralysis agitans ([place])   . Urinary tract infection, site not specified   . Allergic rhinitis, cause unspecified   . Diaphragmatic hernia without mention of obstruction or gangrene   . 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 Chronic kidney disease, unspecified ([place])   . Unspecified  constipation   . Myalgia and myositis, unspecified   . Peripheral vascular disease, unspecified ([place])   . Senile osteoporosis   . GERD (gastroesophageal reflux disease)   . Major depressive disorder, recurrent episode, moderate ([place])   . Hypertension   . Hyperlipidemia   . Parkinson disease ([place])   . Degeneration of lumbar or lumbosacral intervertebral disc   . Rheumatoid arthritis(714.0)   . Generalized osteoarthrosis, involving multiple sites     Past Surgical History  Procedure Laterality Date  . Appendectomy      Patient Care Team: Hennie Duos, MD as PCP - General (Internal Medicine)  Social History   Social History  . Marital Status: Unknown    Spouse Name: N/A  . Number of Children: 6  . Years of Education: N/A   Occupational History  . Not on file.   Social History Main Topics  . Smoking status: Never Smoker   . Smokeless tobacco: Never Used  . Alcohol Use: No  . Drug Use: No  . Sexual Activity: Not on file   Other Topics Concern  . Not on file   Social History Narrative     reports that she has never smoked. She has never used smokeless tobacco. She reports that she does not drink alcohol or use illicit drugs.   There is no immunization history on file for this patient.  Allergies  Allergen Reactions  . Ampicillin Other (See Comments)    MAR    Medications: Patient's Medications  New  Prescriptions   No medications on file  Previous Medications   ALBUTEROL (PROVENTIL) (2.5 MG/3ML) 0.083% NEBULIZER SOLUTION    Take 2.5 mg by nebulization every 8 (eight) hours as needed for wheezing or shortness of breath.   AMBULATORY NON FORMULARY MEDICATION    Magic Cup every day   AMBULATORY NON FORMULARY MEDICATION    Occusoft Baby eyelid towelette 1 wipe every night   APOMORPHINE HYDROCHLORIDE (APOKYN) 10 MG/ML SOLN    Inject 0.2 mLs into the skin. Five times per day   BISACODYL (DULCOLAX) 10 MG SUPPOSITORY    Place 10 mg rectally as needed for  moderate constipation.   CARBIDOPA-LEVODOPA-ENTACAPONE (STALEVO 50 PO)    Take 1 tablet by mouth 3 (three) times daily.   CETIRIZINE (ZYRTEC) 10 MG TABLET    Take 10 mg by mouth daily.   CHOLECALCIFEROL (VITAMIN D3) 50000 UNITS TABS    Take 1 tablet by mouth daily.   DULOXETINE (CYMBALTA) 60 MG CAPSULE    Take 60 mg by mouth daily.   FENTANYL (DURAGESIC - DOSED MCG/HR) 25 MCG/HR PATCH    Apply one patch topically every 72 hours. Remove old patch before placement. Rotate sites   FOLIC ACID-VITAMIN B COMPLEX-VITAMIN C-SELENIUM-ZINC (DIALYVITE) 3 MG TABS TABLET    Take 1 tablet by mouth daily.   GLUCOSAMINE-CHONDROITIN (MOVE FREE PO)    Take 1 tablet by mouth 3 (three) times daily.   HYDROMORPHONE (DILAUDID) 4 MG TABLET    Take one tablet by mouth every 8 hours as needed for pain.   LINIMENTS (SALONPAS PAIN RELIEF PATCH EX)    Apply topically as needed.   MAGNESIUM HYDROXIDE (MILK OF MAGNESIA PO)    Take 30 mLs by mouth as needed.   METOPROLOL SUCCINATE (TOPROL-XL) 25 MG 24 HR TABLET    Take 25 mg by mouth daily.   NITROGLYCERIN (NITROSTAT) 0.4 MG SL TABLET    Place 0.4 mg under the tongue every 5 (five) minutes as needed for chest pain.   NON FORMULARY    Take 120 mLs by mouth 2 (two) times daily. Med Pass   NON FORMULARY    Take 1 tablet by mouth 3 (three) times daily. Move Free Advanced   500/67/555m   OMEGA-3 FATTY ACIDS (FISH OIL) 1000 MG CAPS    Take 1 capsule by mouth daily.   OMEPRAZOLE (PRILOSEC) 40 MG CAPSULE    Take 40 mg by mouth daily.   OYSTER SHELL 500 MG TABS    Take 500 mg by mouth 2 (two) times daily.   POLYETHYLENE GLYCOL POWDER (GLYCOLAX/MIRALAX) POWDER    Take 17 g by mouth 2 (two) times daily.   PRAMIPEXOLE (MIRAPEX) 1 MG TABLET    Take 1 mg by mouth 3 (three) times daily.   PREDNISOLONE 5 MG TABS TABLET    Take 5 mg by mouth daily.   PROPYLENE GLYCOL (SYSTANE BALANCE) 0.6 % SOLN    Apply to eye. Instill 1 drop OU TID for dry eyes   SENNOSIDES-DOCUSATE SODIUM (SENNA S PO)     Take by mouth. Take 2 tablet by mouth twice a day  for constipation   SODIUM CHLORIDE (OCEAN) 0.65 % SOLN NASAL SPRAY    Place 2 sprays into both nostrils 2 (two) times daily.  Modified Medications   No medications on file  Discontinued Medications   No medications on file    Review of Systems  Unable to perform ROS: Dementia    Filed Vitals:   03/04/15  1717  BP: 129/69  Pulse: 64  Temp: 96.5 F (35.8 C)  SpO2: 98%   There is no weight on file to calculate BMI.  Physical Exam  Constitutional: She appears well-developed.  Frail appearing in NAD, sitting in w/c at bedside  HENT:  Mouth/Throat: Oropharynx is clear and moist. No oropharyngeal exudate.  R>L maxillary sinus TTP with boggy tissue texture changes  Eyes: Pupils are equal, round, and reactive to light. No scleral icterus.  Neck: Neck supple. Carotid bruit is not present. No tracheal deviation present. No thyromegaly present.  Cardiovascular: Normal rate, regular rhythm and intact distal pulses.  Exam reveals no gallop and no friction rub.   Murmur (1/6 SEM) heard. No LE edema b/l. no calf TTP.   Pulmonary/Chest: Effort normal and breath sounds normal. No stridor. No respiratory distress. She has no wheezes. She has no rales.  Abdominal: Soft. Bowel sounds are normal. She exhibits no distension and no mass. There is no hepatomegaly. There is no tenderness. There is no rebound and no guarding.  Musculoskeletal: She exhibits edema (small and large joint deformities and swelling) and tenderness.  Lymphadenopathy:    She has no cervical adenopathy.  Neurological: She is alert. She displays atrophy and tremor. She exhibits abnormal muscle tone.  Rigidity noted   Skin: Skin is warm and dry. No rash noted.  Psychiatric: She has a normal mood and affect. Her behavior is normal.     Labs reviewed: No visits with results within 3 Month(s) from this visit. Latest known visit with results is:  Admission on 04/07/2014,  Discharged on 04/07/2014  Component Date Value Ref Range Status  . Troponin i, poc 04/07/2014 0.01  0.00 - 0.08 ng/mL Final  . Comment 3 04/07/2014          Final   Comment: Due to the release kinetics of cTnI, a negative result within the first hours of the onset of symptoms does not rule out myocardial infarction with certainty. If myocardial infarction is still suspected, repeat the test at appropriate intervals.   . Sodium 04/07/2014 139  135 - 145 mmol/L Final  . Potassium 04/07/2014 3.7  3.5 - 5.1 mmol/L Final  . Chloride 04/07/2014 103  96 - 112 mmol/L Final  . CO2 04/07/2014 26  19 - 32 mmol/L Final  . Glucose, Bld 04/07/2014 136* 70 - 99 mg/dL Final  . BUN 04/07/2014 15  6 - 23 mg/dL Final  . Creatinine, Ser 04/07/2014 0.78  0.50 - 1.10 mg/dL Final  . Calcium 04/07/2014 9.0  8.4 - 10.5 mg/dL Final  . Total Protein 04/07/2014 5.9* 6.0 - 8.3 g/dL Final  . Albumin 04/07/2014 3.5  3.5 - 5.2 g/dL Final  . AST 04/07/2014 34  0 - 37 U/L Final  . ALT 04/07/2014 6  0 - 35 U/L Final  . Alkaline Phosphatase 04/07/2014 51  39 - 117 U/L Final  . Total Bilirubin 04/07/2014 0.3  0.3 - 1.2 mg/dL Final  . GFR calc non Af Amer 04/07/2014 76* >90 mL/min Final  . GFR calc Af Amer 04/07/2014 88* >90 mL/min Final   Comment: (NOTE) The eGFR has been calculated using the CKD EPI equation. This calculation has not been validated in all clinical situations. eGFR's persistently <90 mL/min signify possible Chronic Kidney Disease.   . Anion gap 04/07/2014 10  5 - 15 Final  . WBC 04/07/2014 6.6  4.0 - 10.5 K/uL Final  . RBC 04/07/2014 3.97  3.87 - 5.11 MIL/uL Final  .  Hemoglobin 04/07/2014 12.3  12.0 - 15.0 g/dL Final  . HCT 04/07/2014 37.2  36.0 - 46.0 % Final  . MCV 04/07/2014 93.7  78.0 - 100.0 fL Final  . MCH 04/07/2014 31.0  26.0 - 34.0 pg Final  . MCHC 04/07/2014 33.1  30.0 - 36.0 g/dL Final  . RDW 04/07/2014 13.2  11.5 - 15.5 % Final  . Platelets 04/07/2014 136* 150 - 400 K/uL Final  .  Neutrophils Relative % 04/07/2014 78* 43 - 77 % Final  . Lymphocytes Relative 04/07/2014 15  12 - 46 % Final  . Monocytes Relative 04/07/2014 7  3 - 12 % Final  . Eosinophils Relative 04/07/2014 0  0 - 5 % Final  . Basophils Relative 04/07/2014 0  0 - 1 % Final  . Neutro Abs 04/07/2014 5.1  1.7 - 7.7 K/uL Final  . Lymphs Abs 04/07/2014 1.0  0.7 - 4.0 K/uL Final  . Monocytes Absolute 04/07/2014 0.5  0.1 - 1.0 K/uL Final  . Eosinophils Absolute 04/07/2014 0.0  0.0 - 0.7 K/uL Final  . Basophils Absolute 04/07/2014 0.0  0.0 - 0.1 K/uL Final  . Smear Review 04/07/2014 MORPHOLOGY UNREMARKABLE   Final  . Lipase 04/07/2014 23  11 - 59 U/L Final  . D-Dimer, Quant 04/07/2014 0.61* 0.00 - 0.48 ug/mL-FEU Final   Comment:        AT THE INHOUSE ESTABLISHED CUTOFF VALUE OF 0.48 ug/mL FEU, THIS ASSAY HAS BEEN DOCUMENTED IN THE LITERATURE TO HAVE A SENSITIVITY AND NEGATIVE PREDICTIVE VALUE OF AT LEAST 98 TO 99%.  THE TEST RESULT SHOULD BE CORRELATED WITH AN ASSESSMENT OF THE CLINICAL PROBABILITY OF DVT / VTE.   Marland Kitchen Troponin i, poc 04/07/2014 0.00  0.00 - 0.08 ng/mL Final  . Comment 3 04/07/2014          Final   Comment: Due to the release kinetics of cTnI, a negative result within the first hours of the onset of symptoms does not rule out myocardial infarction with certainty. If myocardial infarction is still suspected, repeat the test at appropriate intervals.     No results found.   Assessment/Plan   ICD-9-CM ICD-10-CM   1. Seasonal allergic rhinitis due to pollen 477.0 J30.1   2. Recurrent UTI 599.0 N39.0   3. Rheumatoid arthritis involving multiple joints (HCC) 714.0 M06.9   4. Parkinson's disease ([place]) 332.0 G20   5. Dementia due to Parkinson's disease without behavioral disturbance ([place]) 332.0 G20    294.10 F02.80   6. Major depressive disorder, recurrent episode, moderate (HCC) 296.32 F33.1   7. Chronic pain syndrome 338.4 G89.4   8. Essential hypertension 401.9 I10   9.  Gastroesophageal reflux disease without esophagitis 530.81 K21.9   10. Peripheral polyneuropathy (HCC) 356.9 G62.9     D/w [facility] NP, Brandy - will not make any changes to current medical regimen. She has recurrent UTI but is asymptomatic. Seasonal allergy currently being tx with antihistamine and flonase. She takes chronic prednisone for RA.  Cont current meds as ordered  PT/OT/ST as ordered  Nutritional supplements as ordered  Will follow  Myles Tavella S. Perlie Gold  [facility] [address] [place], [place] [address] [phone] Cell (Monday-Friday 8 AM - 5 PM) [phone] After 5 PM and follow prompts

## 2015-03-19 LAB — CBC AND DIFFERENTIAL
HCT: 37 % (ref 36–46)
HEMOGLOBIN: 12.4 g/dL (ref 12.0–16.0)
Platelets: 284 10*3/uL (ref 150–399)
WBC: 7.1 10*3/mL

## 2015-03-19 LAB — BASIC METABOLIC PANEL WITH GFR
BUN: 19 mg/dL (ref 4–21)
Creatinine: 0.8 mg/dL (ref 0.5–1.1)
Glucose: 118 mg/dL
Potassium: 4.6 mmol/L (ref 3.4–5.3)
Sodium: 138 mmol/L (ref 137–147)

## 2015-03-19 LAB — HEPATIC FUNCTION PANEL
ALT: 11 U/L (ref 7–35)
AST: 17 U/L (ref 13–35)
Alkaline Phosphatase: 55 U/L (ref 25–125)
Bilirubin, Total: 0.3 mg/dL

## 2015-04-11 ENCOUNTER — Non-Acute Institutional Stay (SKILLED_NURSING_FACILITY): Payer: Medicare Other | Admitting: Internal Medicine

## 2015-04-11 ENCOUNTER — Encounter: Payer: Self-pay | Admitting: Internal Medicine

## 2015-04-11 DIAGNOSIS — F331 Major depressive disorder, recurrent, moderate: Secondary | ICD-10-CM | POA: Diagnosis not present

## 2015-04-11 DIAGNOSIS — K219 Gastro-esophageal reflux disease without esophagitis: Secondary | ICD-10-CM

## 2015-04-11 DIAGNOSIS — G894 Chronic pain syndrome: Secondary | ICD-10-CM

## 2015-04-11 DIAGNOSIS — G20 Parkinson's disease: Secondary | ICD-10-CM

## 2015-04-11 DIAGNOSIS — I10 Essential (primary) hypertension: Secondary | ICD-10-CM | POA: Diagnosis not present

## 2015-04-11 DIAGNOSIS — F0280 Dementia in other diseases classified elsewhere without behavioral disturbance: Secondary | ICD-10-CM

## 2015-04-11 DIAGNOSIS — R1311 Dysphagia, oral phase: Secondary | ICD-10-CM | POA: Diagnosis not present

## 2015-04-11 DIAGNOSIS — M069 Rheumatoid arthritis, unspecified: Secondary | ICD-10-CM

## 2015-04-11 NOTE — Progress Notes (Signed)
Patient ID: Sheri Molina, female   DOB: 10/25/1929, 82 y.o.   MRN: [id number]    DATE: 04/11/15  Location:  [facility]  Nursing Home Room Number: 127 A Place of Service: SNF (31)   Extended Emergency Contact Information Primary Emergency Contact: Flow,Joel Address: [address]          [place], [place] [address] [place] Home Phone: [phone] Relation: Son  Advanced Directive information Does patient have an advance directive?: Yes, Type of Advance Directive: Out of facility DNR (pink MOST or yellow form), Does patient want to make changes to advanced directive?: No - Patient declined  Chief Complaint  Patient presents with  . Medical Management of Chronic Issues; [facility]    HPI:  82 yo long term resident seen today for f/u. She c/o dysphagia and when is tired of swallowing challenges "I just stop eating". She has difficulty grasping eating utensils and states "peas just fall off" as her wrist does not fully supinate. She would like to take glucosamine and chondroitin which helps her joint pain.  She is also taking fish oil and Vit D. she is a poor historian due to Parkinson's related dementia. Hx obtained form chart  Chronic pain/FMS/RA/OA - right shoulder frozen and can only raise it about 30 degrees. She has neuropathy. Pain stable with dilaudid and fentanyl. She states her pain pacth falls off easily. She is taking prednisone long term  HTN - stable on metoprolol. She has not req'd use of NTG for CP  Depression - stable on cymbalta  CKD - stable Cr  GERD - stable on prilosec, miralax  Tremor/Parkinson's - on mirapex and sinemet as well as apokyn. She is slowly declining  Recurrent UTI - recent UA revealed >100K E coli but she is asymptomatic. Family declines aggressive tx of asymptomatic UTI. [facility] provider aware  She takes vitamins, minerals and other supplements. she also has nutritional supplements   Past Medical History    Diagnosis Date  . Localized superficial swelling, mass, or lump   . Paralysis agitans (HCC)   . Urinary tract infection, site not specified   . Allergic rhinitis, cause unspecified   . Diaphragmatic hernia without mention of obstruction or gangrene   . 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 Chronic kidney disease, unspecified (HCC)   . Unspecified constipation   . Myalgia and myositis, unspecified   . Peripheral vascular disease, unspecified (HCC)   . Senile osteoporosis   . GERD (gastroesophageal reflux disease)   . Major depressive disorder, recurrent episode, moderate (HCC)   . Hypertension   . Hyperlipidemia   . Parkinson disease (HCC)   . Degeneration of lumbar or lumbosacral intervertebral disc   . Rheumatoid arthritis(714.0)   . Generalized osteoarthrosis, involving multiple sites     Past Surgical History  Procedure Laterality Date  . Appendectomy      Patient Care Team: Kirt Boys, DO as PCP - General (Internal Medicine)  Social History   Social History  . Marital Status: Unknown    Spouse Name: N/A  . Number of Children: 6  . Years of Education: N/A   Occupational History  . Not on file.   Social History Main Topics  . Smoking status: Never Smoker   . Smokeless tobacco: Never Used  . Alcohol Use: No  . Drug Use: No  . Sexual Activity: Not  on file   Other Topics Concern  . Not on file   Social History Narrative     reports that she has never smoked. She has never used smokeless tobacco. She reports that she does not drink alcohol or use illicit drugs.   There is no immunization history on file for this patient.  Allergies  Allergen Reactions  . Ampicillin Other (See Comments)    MAR    Medications: Patient's Medications  New Prescriptions   No medications on file  Previous Medications   ALBUTEROL (PROVENTIL) (2.5 MG/3ML)  0.083% NEBULIZER SOLUTION    Take 2.5 mg by nebulization every 8 (eight) hours as needed for wheezing or shortness of breath.   AMBULATORY NON FORMULARY MEDICATION    Magic Cup every day   AMBULATORY NON FORMULARY MEDICATION    Occusoft Baby eyelid towelette 1 wipe every night   APOMORPHINE HYDROCHLORIDE (APOKYN) 10 MG/ML SOLN    Inject 0.2 mLs into the skin. Five times per day   BISACODYL (DULCOLAX) 10 MG SUPPOSITORY    Place 10 mg rectally as needed for moderate constipation.   CARBIDOPA-LEVODOPA-ENTACAPONE (STALEVO 50 PO)    Take 1 tablet by mouth 3 (three) times daily.   CETIRIZINE (ZYRTEC) 10 MG TABLET    Take 10 mg by mouth daily.   CRANBERRY-VITAMIN C-INULIN (UTI-STAT) LIQD    Take 30 mLs by mouth 2 (two) times daily.   DULOXETINE (CYMBALTA) 60 MG CAPSULE    Take 60 mg by mouth daily.   FENTANYL (DURAGESIC - DOSED MCG/HR) 25 MCG/HR PATCH    Apply one patch topically every 72 hours. Remove old patch before placement. Rotate sites   FLUTICASONE (FLONASE) 50 MCG/ACT NASAL SPRAY    Place 2 sprays into both nostrils daily.   FOLIC ACID-VITAMIN B COMPLEX-VITAMIN C-SELENIUM-ZINC (DIALYVITE) 3 MG TABS TABLET    Take 1 tablet by mouth daily.   GLUCOSAMINE-CHONDROITIN (MOVE FREE PO)    Take 1 tablet by mouth 3 (three) times daily.   HYDROMORPHONE (DILAUDID) 4 MG TABLET    Take one tablet by mouth every 8 hours as needed for pain.   LINIMENTS (SALONPAS PAIN RELIEF PATCH EX)    Apply topically as needed.   MAGNESIUM HYDROXIDE (MILK OF MAGNESIA PO)    Take 30 mLs by mouth as needed.   METOPROLOL SUCCINATE (TOPROL-XL) 25 MG 24 HR TABLET    Take 25 mg by mouth daily.   NITROGLYCERIN (NITROSTAT) 0.4 MG SL TABLET    Place 0.4 mg under the tongue every 5 (five) minutes as needed for chest pain.   NON FORMULARY    Take 120 mLs by mouth 2 (two) times daily. Med Pass   OMEGA-3 FATTY ACIDS (FISH OIL) 1000 MG CAPS    Take 1 capsule by mouth daily.   OMEPRAZOLE (PRILOSEC) 40 MG CAPSULE    Take 40 mg by mouth  daily.   OYSTER SHELL 500 MG TABS    Take 500 mg by mouth 2 (two) times daily.   POLYETHYLENE GLYCOL POWDER (GLYCOLAX/MIRALAX) POWDER    Take 17 g by mouth 2 (two) times daily.   PRAMIPEXOLE (MIRAPEX) 1 MG TABLET    Take 1 mg by mouth 3 (three) times daily.   PREDNISOLONE 5 MG TABS TABLET    Take 5 mg by mouth daily.   PROPYLENE GLYCOL (SYSTANE BALANCE) 0.6 % SOLN    Apply to eye. Instill 1 drop OU TID for dry eyes   SENNOSIDES-DOCUSATE SODIUM (SENNA S PO)  Take by mouth. Take 2 tablet by mouth twice a day  for constipation  Modified Medications   No medications on file  Discontinued Medications   CHOLECALCIFEROL (VITAMIN D3) 50000 UNITS TABS    Take 1 tablet by mouth daily.   NON FORMULARY    Take 1 tablet by mouth 3 (three) times daily. Move Free Advanced   500/67/500mg    SODIUM CHLORIDE (OCEAN) 0.65 % SOLN NASAL SPRAY    Place 2 sprays into both nostrils 2 (two) times daily.    Review of Systems  Unable to perform ROS: Dementia    Filed Vitals:   04/11/15 0920  BP: 110/65  Pulse: 78  Temp: 97.9 F (36.6 C)  TempSrc: Oral  Resp: 18  Weight: 109 lb (49.442 kg)   Body mass index is 18.14 kg/(m^2).  Physical Exam  Constitutional: She appears well-developed.  Frail appearing in NAD, sitting in w/c at bedside  HENT:  Mouth/Throat: Oropharynx is clear and moist. No oropharyngeal exudate.  Eyes: Pupils are equal, round, and reactive to light. No scleral icterus.  Neck: Neck supple. Carotid bruit is not present. No tracheal deviation present. No thyromegaly present.  Cardiovascular: Normal rate, regular rhythm and intact distal pulses.  Exam reveals no gallop and no friction rub.   Murmur (1/6 SEM) heard. No LE edema b/l. no calf TTP.   Pulmonary/Chest: Effort normal and breath sounds normal. No stridor. No respiratory distress. She has no wheezes. She has no rales.  Abdominal: Soft. Bowel sounds are normal. She exhibits no distension and no mass. There is no hepatomegaly. There  is no tenderness. There is no rebound and no guarding.  Musculoskeletal: She exhibits edema (small and large joint deformities and swelling) and tenderness.  Lymphadenopathy:    She has no cervical adenopathy.  Neurological: She is alert. She displays atrophy and tremor. She exhibits abnormal muscle tone.  Rigidity noted   Skin: Skin is warm and dry. No rash noted.  Psychiatric: She has a normal mood and affect. Her behavior is normal.     Labs reviewed: Nursing Home on 04/11/2015  Component Date Value Ref Range Status  . Hemoglobin 03/19/2015 12.4  12.0 - 16.0 g/dL Final  . HCT 89/38/1017 37  36 - 46 % Final  . Platelets 03/19/2015 284  150 - 399 K/L Final  . WBC 03/19/2015 7.1   Final  . Glucose 03/19/2015 118   Final  . BUN 03/19/2015 19  4 - 21 mg/dL Final  . Creatinine 51/02/5850 0.8  0.5 - 1.1 mg/dL Final  . Potassium 77/82/4235 4.6  3.4 - 5.3 mmol/L Final  . Sodium 03/19/2015 138  137 - 147 mmol/L Final  . Alkaline Phosphatase 03/19/2015 55  25 - 125 U/L Final  . ALT 03/19/2015 11  7 - 35 U/L Final  . AST 03/19/2015 17  13 - 35 U/L Final  . Bilirubin, Total 03/19/2015 0.3   Final    No results found.   Assessment/Plan   ICD-9-CM ICD-10-CM   1. Parkinson's disease (HCC) 332.0 G20   2. Dysphagia, oral phase 787.21 R13.11   3. Rheumatoid arthritis involving multiple joints (HCC) 714.0 M06.9   4. Dementia due to Parkinson's disease without behavioral disturbance (HCC) 332.0 G20    294.10 F02.80   5. Chronic pain syndrome 338.4 G89.4   6. Essential hypertension 401.9 I10   7. Major depressive disorder, recurrent episode, moderate (HCC) 296.32 F33.1   8. Gastroesophageal reflux disease without esophagitis 530.81 K21.9  9. Paralysis agitans (HCC) 332.0 G20     ST eval and tx due to worsening dysphagia  Add glucosamine and chondroitin to med list. She will take TID  Cont other meds as ordered  PT/OT as indicated  [facility] NP to follow  Will follow  Oshea Percival S.  Ancil Linsey  [facility] [address] [place], [place] [address] [phone] Cell (Monday-Friday 8 AM - 5 PM) [phone] After 5 PM and follow prompts

## 2015-04-15 ENCOUNTER — Other Ambulatory Visit (HOSPITAL_COMMUNITY): Payer: Self-pay | Admitting: Internal Medicine

## 2015-04-15 DIAGNOSIS — R1310 Dysphagia, unspecified: Secondary | ICD-10-CM

## 2015-04-17 ENCOUNTER — Ambulatory Visit (HOSPITAL_COMMUNITY)
Admission: RE | Admit: 2015-04-17 | Discharge: 2015-04-17 | Disposition: A | Discharge status: Home / Self Care | Payer: Medicare Other | Source: Ambulatory Visit | Attending: Internal Medicine | Admitting: Internal Medicine

## 2015-04-17 DIAGNOSIS — R1310 Dysphagia, unspecified: Secondary | ICD-10-CM | POA: Diagnosis present

## 2015-04-17 DIAGNOSIS — R1312 Dysphagia, oropharyngeal phase: Secondary | ICD-10-CM | POA: Diagnosis not present

## 2015-05-09 ENCOUNTER — Encounter: Payer: Self-pay | Admitting: Internal Medicine

## 2015-05-09 ENCOUNTER — Non-Acute Institutional Stay (SKILLED_NURSING_FACILITY): Payer: Medicare Other | Admitting: Internal Medicine

## 2015-05-09 DIAGNOSIS — I10 Essential (primary) hypertension: Secondary | ICD-10-CM | POA: Diagnosis not present

## 2015-05-09 DIAGNOSIS — G894 Chronic pain syndrome: Secondary | ICD-10-CM

## 2015-05-09 DIAGNOSIS — K219 Gastro-esophageal reflux disease without esophagitis: Secondary | ICD-10-CM

## 2015-05-09 DIAGNOSIS — M069 Rheumatoid arthritis, unspecified: Secondary | ICD-10-CM | POA: Diagnosis not present

## 2015-05-09 DIAGNOSIS — F331 Major depressive disorder, recurrent, moderate: Secondary | ICD-10-CM | POA: Diagnosis not present

## 2015-05-09 DIAGNOSIS — R1311 Dysphagia, oral phase: Secondary | ICD-10-CM | POA: Diagnosis not present

## 2015-05-09 DIAGNOSIS — G20 Parkinson's disease: Secondary | ICD-10-CM | POA: Diagnosis not present

## 2015-05-09 DIAGNOSIS — G20A1 Parkinson's disease without dyskinesia, without mention of fluctuations: Secondary | ICD-10-CM

## 2015-05-09 DIAGNOSIS — F0280 Dementia in other diseases classified elsewhere without behavioral disturbance: Secondary | ICD-10-CM

## 2015-05-09 NOTE — Progress Notes (Signed)
Patient ID: Sheri Molina, female   DOB: 09-02-1929, 82 y.o.   MRN: [id number]    DATE: 05/09/15  Location:  [facility]    Place of Service: SNF (31)   Extended Emergency Contact Information Primary Emergency Contact: Brensinger,Joel Address: [address] [place], [place] [address] [place] Home Phone: [phone] Relation: Son  Scientist, water quality information  DNR  Chief Complaint  Patient presents with  . Medical Management of Chronic Issues    [facility]    HPI:  82 yo female long term resident seen today for f/u. She c/o UE stiffness today and unable to extend arm fully. No recent trauma. No falls. No nursing issues. she is a poor historian due to dementia. Hx obtained from chart  Chronic pain/FMS/RA/OA - right shoulder frozen and can only raise it about 30 degrees. She has neuropathy. Pain stable with dilaudid and fentanyl. She states her pain patch falls off easily. She is taking prednisone long term  HTN - stable on metoprolol. She has not req'd use of NTG for CP  Depression - stable on cymbalta  CKD - stable Cr  GERD - stable on prilosec, miralax  Dysphagia - she completed modified barium swallow last month that revealed  mild-moderate oropharyngeal dysphagia. SLP recommended "Regular solids;Thin liquid Liquid Administration via Straw;Spoon Medication Administration Whole meds with puree Compensations Slow rate;Small sips/bites Postural Changes Remain semi-upright after after feeds/meals (Comment);Seated upright at 90 degrees"      Tremor/Parkinson's - on mirapex and sinemet as well as apokyn. She is slowly declining  Recurrent UTI - recent UA revealed >100K E coli but she is asymptomatic. Family declines aggressive tx of asymptomatic UTI. [facility] provider aware  She takes vitamins, minerals and other supplements. she also has nutritional supplements  Past Medical History  Diagnosis Date  . Localized superficial swelling, mass, or  lump   . Paralysis agitans (HCC)   . Urinary tract infection, site not specified   . Allergic rhinitis, cause unspecified   . Diaphragmatic hernia without mention of obstruction or gangrene   . 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 Chronic kidney disease, unspecified (HCC)   . Unspecified constipation   . Myalgia and myositis, unspecified   . Peripheral vascular disease, unspecified (HCC)   . Senile osteoporosis   . GERD (gastroesophageal reflux disease)   . Major depressive disorder, recurrent episode, moderate (HCC)   . Hypertension   . Hyperlipidemia   . Parkinson disease (HCC)   . Degeneration of lumbar or lumbosacral intervertebral disc   . Rheumatoid arthritis(714.0)   . Generalized osteoarthrosis, involving multiple sites     Past Surgical History  Procedure Laterality Date  . Appendectomy      Patient Care Team: Kirt Boys, DO as PCP - General (Internal Medicine)  Social History   Social History  . Marital Status: Unknown    Spouse Name: N/A  . Number of Children: 6  . Years of Education: N/A   Occupational History  . Not on file.   Social History Main Topics  . Smoking status: Never Smoker   . Smokeless tobacco: Never Used  . Alcohol Use: No  . Drug Use: No  . Sexual Activity: Not on file   Other Topics Concern  . Not on file   Social History Narrative  reports that she has never smoked. She has never used smokeless tobacco. She reports that she does not drink alcohol or use illicit drugs.   There is no immunization history on file for this patient.  Allergies  Allergen Reactions  . Ampicillin Other (See Comments)    MAR    Medications: Patient's Medications  New Prescriptions   No medications on file  Previous Medications   ALBUTEROL (PROVENTIL) (2.5 MG/3ML) 0.083% NEBULIZER SOLUTION    Take 2.5 mg by nebulization every 8  (eight) hours as needed for wheezing or shortness of breath.   AMBULATORY NON FORMULARY MEDICATION    Magic Cup every day   AMBULATORY NON FORMULARY MEDICATION    Occusoft Baby eyelid towelette 1 wipe every night   APOMORPHINE HYDROCHLORIDE (APOKYN) 10 MG/ML SOLN    Inject 0.2 mLs into the skin. Five times per day   BISACODYL (DULCOLAX) 10 MG SUPPOSITORY    Place 10 mg rectally as needed for moderate constipation.   CARBIDOPA-LEVODOPA-ENTACAPONE (STALEVO 50 PO)    Take 1 tablet by mouth 3 (three) times daily.   CETIRIZINE (ZYRTEC) 10 MG TABLET    Take 10 mg by mouth daily.   CRANBERRY-VITAMIN C-INULIN (UTI-STAT) LIQD    Take 30 mLs by mouth 2 (two) times daily.   DULOXETINE (CYMBALTA) 60 MG CAPSULE    Take 60 mg by mouth daily.   FENTANYL (DURAGESIC - DOSED MCG/HR) 25 MCG/HR PATCH    Apply one patch topically every 72 hours. Remove old patch before placement. Rotate sites   FLUTICASONE (FLONASE) 50 MCG/ACT NASAL SPRAY    Place 2 sprays into both nostrils daily.   FOLIC ACID-VITAMIN B COMPLEX-VITAMIN C-SELENIUM-ZINC (DIALYVITE) 3 MG TABS TABLET    Take 1 tablet by mouth daily.   GLUCOSAMINE-CHONDROITIN (MOVE FREE PO)    Take 1 tablet by mouth 3 (three) times daily.   HYDROMORPHONE (DILAUDID) 4 MG TABLET    Take one tablet by mouth every 8 hours as needed for pain.   LINIMENTS (SALONPAS PAIN RELIEF PATCH EX)    Apply topically as needed.   MAGNESIUM HYDROXIDE (MILK OF MAGNESIA PO)    Take 30 mLs by mouth as needed.   METOPROLOL SUCCINATE (TOPROL-XL) 25 MG 24 HR TABLET    Take 25 mg by mouth daily.   NITROGLYCERIN (NITROSTAT) 0.4 MG SL TABLET    Place 0.4 mg under the tongue every 5 (five) minutes as needed for chest pain.   NON FORMULARY    Take 120 mLs by mouth 2 (two) times daily. Med Pass   OMEGA-3 FATTY ACIDS (FISH OIL) 1000 MG CAPS    Take 1 capsule by mouth daily.   OMEPRAZOLE (PRILOSEC) 40 MG CAPSULE    Take 40 mg by mouth daily.   OYSTER SHELL 500 MG TABS    Take 500 mg by mouth 2 (two)  times daily.   POLYETHYLENE GLYCOL POWDER (GLYCOLAX/MIRALAX) POWDER    Take 17 g by mouth 2 (two) times daily.   PRAMIPEXOLE (MIRAPEX) 1 MG TABLET    Take 1 mg by mouth 3 (three) times daily.   PREDNISOLONE 5 MG TABS TABLET    Take 5 mg by mouth daily.   PROPYLENE GLYCOL (SYSTANE BALANCE) 0.6 % SOLN    Apply to eye. Instill 1 drop OU TID for dry eyes   SENNOSIDES-DOCUSATE SODIUM (SENNA S PO)    Take by mouth. Take 2 tablet by mouth twice a day  for constipation  Modified Medications  No medications on file  Discontinued Medications   No medications on file    Review of Systems  Unable to perform ROS: Dementia    Filed Vitals:   05/09/15 1619  BP: 106/71  Pulse: 72  Temp: 96.5 F (35.8 C)  Weight: 114 lb 3.2 oz (51.801 kg)   Body mass index is 19 kg/(m^2).  Physical Exam  Constitutional: She appears well-developed.  Frail appearing in NAD, sitting in w/c at bedside  HENT:  Mouth/Throat: Oropharynx is clear and moist. No oropharyngeal exudate.  Eyes: Pupils are equal, round, and reactive to light. No scleral icterus.  Neck: Neck supple. Carotid bruit is not present. No tracheal deviation present. No thyromegaly present.  Cardiovascular: Normal rate, regular rhythm and intact distal pulses.  Exam reveals no gallop and no friction rub.   Murmur (1/6 SEM) heard. No LE edema b/l. no calf TTP.   Pulmonary/Chest: Effort normal and breath sounds normal. No stridor. No respiratory distress. She has no wheezes. She has no rales.  Scattered congested BS that clear with cough  Abdominal: Soft. Bowel sounds are normal. She exhibits no distension and no mass. There is no hepatomegaly. There is no tenderness. There is no rebound and no guarding.  Musculoskeletal: She exhibits edema (small and large joint deformities and swelling) and tenderness.  RUE swelling > LUE  Lymphadenopathy:    She has no cervical adenopathy.  Neurological: She is alert. She displays atrophy and tremor. She  exhibits abnormal muscle tone.  Rigidity noted   Skin: Skin is warm and dry. No rash noted.  Psychiatric: She has a normal mood and affect. Her behavior is normal.     Labs reviewed: Nursing Home on 04/11/2015  Component Date Value Ref Range Status  . Hemoglobin 03/19/2015 12.4  12.0 - 16.0 g/dL Final  . HCT 53/66/4403 37  36 - 46 % Final  . Platelets 03/19/2015 284  150 - 399 K/L Final  . WBC 03/19/2015 7.1   Final  . Glucose 03/19/2015 118   Final  . BUN 03/19/2015 19  4 - 21 mg/dL Final  . Creatinine 47/42/5956 0.8  0.5 - 1.1 mg/dL Final  . Potassium 38/75/6433 4.6  3.4 - 5.3 mmol/L Final  . Sodium 03/19/2015 138  137 - 147 mmol/L Final  . Alkaline Phosphatase 03/19/2015 55  25 - 125 U/L Final  . ALT 03/19/2015 11  7 - 35 U/L Final  . AST 03/19/2015 17  13 - 35 U/L Final  . Bilirubin, Total 03/19/2015 0.3   Final    Dg Op Swallowing Func-medicare/speech Path  04/17/2015  Objective Swallowing Evaluation: Type of Study: MBS-Modified Barium Swallow Study Patient Details Name: Nohea Graf MRN: [id number] Date of Birth: Nov 04, 1929 Today's Date: 04/17/2015 Time: SLP Start Time (ACUTE ONLY): 1135-SLP Stop Time (ACUTE ONLY): 1150 SLP Time Calculation (min) (ACUTE ONLY): 15 min Past Medical History: Past Medical History Diagnosis Date . Localized superficial swelling, mass, or lump  . Paralysis agitans (HCC)  . Urinary tract infection, site not specified  . Allergic rhinitis, cause unspecified  . Diaphragmatic hernia without mention of obstruction or gangrene  . Unspecified hypertensive heart and kidney disease without heart failure and with chronic kidney disease stage I through stage IV, or unspecified  . Chronic kidney disease, unspecified (HCC)  . Unspecified constipation  . Myalgia and myositis, unspecified  . Peripheral vascular disease, unspecified (HCC)  . Senile osteoporosis  . GERD (gastroesophageal reflux disease)  . Major depressive disorder, recurrent episode,  moderate (HCC)  .  Hypertension  . Hyperlipidemia  . Parkinson disease (HCC)  . Degeneration of lumbar or lumbosacral intervertebral disc  . Rheumatoid arthritis(714.0)  . Generalized osteoarthrosis, involving multiple sites  Past Surgical History: Past Surgical History Procedure Laterality Date . Appendectomy   HPI: Pt is an 82 y/o f with history of Parkinson's, GERD and arthritis referred for a MBSS due to concern for decline with swallow function.  Subjective: "I can drink fine." Assessment / Plan / Recommendation CHL IP CLINICAL IMPRESSIONS 04/17/2015 Therapy Diagnosis Moderate pharyngeal phase dysphagia Clinical Impression Pt participated with MBSS which revealed mild-moderate oropharyngeal dysphagia characterized by delay of pharyngeal initiation resulting in laryngeal penetration with liquids(thin and nectar) and aspiration with regular-sized straw sips of thin. Pt did demonstrate sensation to aspiration event and demonstrated a cough response, however this was ineffective to clear airway. Limiting volume of thin liquid to teaspoon amounts and using straw with directive to "take a very small sip" was effective to eliminate aspiration with thins. Pt also benefitted from cueing to hold then swallow to facilitate oral containment and a more timely pharyngeal response. Recommend: Regular/mech soft with thins. Small bites/sips. Upright positioning with all PO. Continue SLP services at SNF level.  Impact on safety and function Moderate aspiration risk   No flowsheet data found.  No flowsheet data found. CHL IP DIET RECOMMENDATION 04/17/2015 SLP Diet Recommendations Regular solids;Thin liquid Liquid Administration via Straw;Spoon Medication Administration Whole meds with puree Compensations Slow rate;Small sips/bites Postural Changes Remain semi-upright after after feeds/meals (Comment);Seated upright at 90 degrees   CHL IP OTHER RECOMMENDATIONS 04/17/2015 Recommended Consults -- Oral Care Recommendations Oral care BID Other  Recommendations --   No flowsheet data found.  No flowsheet data found.     CHL IP ORAL PHASE 04/17/2015 Oral Phase WFL Oral - Pudding Teaspoon -- Oral - Pudding Cup -- Oral - Honey Teaspoon -- Oral - Honey Cup -- Oral - Nectar Teaspoon -- Oral - Nectar Cup -- Oral - Nectar Straw -- Oral - Thin Teaspoon -- Oral - Thin Cup -- Oral - Thin Straw -- Oral - Puree -- Oral - Mech Soft -- Oral - Regular -- Oral - Multi-Consistency -- Oral - Pill -- Oral Phase - Comment --  CHL IP PHARYNGEAL PHASE 04/17/2015 Pharyngeal Phase Impaired Pharyngeal- Pudding Teaspoon -- Pharyngeal -- Pharyngeal- Pudding Cup -- Pharyngeal -- Pharyngeal- Honey Teaspoon -- Pharyngeal -- Pharyngeal- Honey Cup -- Pharyngeal -- Pharyngeal- Nectar Teaspoon -- Pharyngeal -- Pharyngeal- Nectar Cup -- Pharyngeal -- Pharyngeal- Nectar Straw Penetration/Aspiration during swallow;Delayed swallow initiation-vallecula Pharyngeal Material enters airway, remains ABOVE vocal cords then ejected out Pharyngeal- Thin Teaspoon Penetration/Aspiration before swallow;Delayed swallow initiation-pyriform sinuses Pharyngeal Material enters airway, remains ABOVE vocal cords then ejected out Pharyngeal- Thin Cup -- Pharyngeal -- Pharyngeal- Thin Straw Moderate aspiration;Delayed swallow initiation-pyriform sinuses;Penetration/Aspiration during swallow Pharyngeal Material enters airway, passes BELOW cords and not ejected out despite cough attempt by patient Pharyngeal- Puree WFL Pharyngeal -- Pharyngeal- Mechanical Soft -- Pharyngeal -- Pharyngeal- Regular WFL Pharyngeal -- Pharyngeal- Multi-consistency -- Pharyngeal -- Pharyngeal- Pill WFL Pharyngeal -- Pharyngeal Comment --  CHL IP CERVICAL ESOPHAGEAL PHASE 04/17/2015 Cervical Esophageal Phase (No Data) Pudding Teaspoon -- Pudding Cup -- Honey Teaspoon -- Honey Cup -- Nectar Teaspoon -- Nectar Cup -- Nectar Straw -- Thin Teaspoon -- Thin Cup -- Thin Straw -- Puree -- Mechanical Soft -- Regular -- Multi-consistency -- Pill --  Cervical Esophageal Comment -- CHL IP GO 04/17/2015 Functional Assessment Tool Used clinician judgement  Functional Limitations Swallowing Swallow Current Status [phone]) CJ Swallow Goal Status (A2130) CJ Swallow Discharge Status [phone]) CJ Motor Speech Current Status [phone]) (None) Motor Speech Goal Status [phone]) (None) Motor Speech Goal Status [phone]) (None) Spoken Language Comprehension Current Status [phone]) (None) Spoken Language Comprehension Goal Status (O5366) (None) Spoken Language Comprehension Discharge Status [phone]) (None) Spoken Language Expression Current Status [phone]) (None) Spoken Language Expression Goal Status (D6387) (None) Spoken Language Expression Discharge Status [phone]) (None) Attention Current Status (I9518) (None) Attention Goal Status (A4166) (None) Attention Discharge Status [phone]) (None) Memory Current Status (S0109) (None) Memory Goal Status (N2355) (None) Memory Discharge Status (D3220) (None) Voice Current Status (U5427) (None) Voice Goal Status (C6237) (None) Voice Discharge Status (S2831) (None) Other Speech-Language Pathology Functional Limitation [phone]) (None) Other Speech-Language Pathology Functional Limitation Goal Status (Y0737) (None) Other Speech-Language Pathology Functional Limitation Discharge Status [phone]) (None) Rocky Crafts MA, CCC-SLP Pager [phone] 04/17/2015, 4:16 PM            CLINICAL DATA:  82 year old female with Parkinson's disease. Poor feeding. Initial encounter. EXAM: MODIFIED BARIUM SWALLOW TECHNIQUE: Different consistencies of barium were administered orally to the patient by the Speech Pathologist. Imaging of the pharynx was performed in the lateral projection. FLUOROSCOPY TIME:  Radiation Exposure Index (as provided by the fluoroscopic device): If the device does not provide the exposure index: Fluoroscopy Time:  0 minutes 43 seconds Number of Acquired Images:    None COMPARISON:  Chest radiographs 04/07/2014 FINDINGS: Thin liquid- satisfactory with  chin-tuck and when fed from spoon. When drinking from a cup/straw even with the chin-tuck there was aspiration which elicited a weak cough. Premature spill noted throughout. Nectar thick liquid- mild flash penetration, otherwise normal. Honey- not administered Pure- within normal limits Cracker-within normal limits Pure with barium tablet - within normal limits IMPRESSION: Satisfactory except when drinking thin liquids from a cup/through a straw. Please refer to the Speech Pathologists report for complete details and recommendations. Electronically Signed   By: Odessa Fleming M.D.   On: 04/17/2015 13:39     Assessment/Plan   ICD-9-CM ICD-10-CM   1. Parkinson's disease (HCC) 332.0 G20   2. Dementia due to Parkinson's disease without behavioral disturbance (HCC) 332.0 G20    294.10 F02.80   3. Rheumatoid arthritis involving multiple joints (HCC) 714.0 M06.9   4. Chronic pain syndrome 338.4 G89.4   5. Paralysis agitans (HCC) 332.0 G20   6. Major depressive disorder, recurrent episode, moderate (HCC) 296.32 F33.1   7. Essential hypertension 401.9 I10   8. Gastroesophageal reflux disease without esophagitis 530.81 K21.9   9. Dysphagia, oral phase 787.21 R13.11     [facility]  Provider to follow  Pt is medically stable on current tx plan. Continue current medications as ordered. PT/OT/ST as indicated. Will follow  Nandika Stetzer S. Ancil Linsey  [facility] [address] [place], [place] [address] [phone] Cell (Monday-Friday 8 AM - 5 PM) [phone] After 5 PM and follow prompts

## 2015-06-10 ENCOUNTER — Non-Acute Institutional Stay (SKILLED_NURSING_FACILITY): Payer: Medicare Other | Admitting: Internal Medicine

## 2015-06-10 ENCOUNTER — Encounter: Payer: Self-pay | Admitting: Internal Medicine

## 2015-06-10 DIAGNOSIS — G20 Parkinson's disease: Secondary | ICD-10-CM

## 2015-06-10 DIAGNOSIS — M069 Rheumatoid arthritis, unspecified: Secondary | ICD-10-CM | POA: Diagnosis not present

## 2015-06-10 DIAGNOSIS — F0280 Dementia in other diseases classified elsewhere without behavioral disturbance: Secondary | ICD-10-CM

## 2015-06-10 DIAGNOSIS — J301 Allergic rhinitis due to pollen: Secondary | ICD-10-CM

## 2015-06-10 DIAGNOSIS — F331 Major depressive disorder, recurrent, moderate: Secondary | ICD-10-CM

## 2015-06-10 DIAGNOSIS — G894 Chronic pain syndrome: Secondary | ICD-10-CM | POA: Diagnosis not present

## 2015-06-10 DIAGNOSIS — K219 Gastro-esophageal reflux disease without esophagitis: Secondary | ICD-10-CM

## 2015-06-10 DIAGNOSIS — I10 Essential (primary) hypertension: Secondary | ICD-10-CM | POA: Diagnosis not present

## 2015-06-10 NOTE — Progress Notes (Signed)
DATE: 06/10/15  Location:  [facility]  Nursing Home Room Number: 127 A Place of Service: SNF (31)   Extended Emergency Contact Information Primary Emergency Contact: Radu,Joel Address: [address]          [place], [place] [address] [place] Home Phone: [phone] Relation: Son  Advanced Directive information Does patient have an advance directive?: Yes, Type of Advance Directive: Out of facility DNR (pink MOST or yellow form), Does patient want to make changes to advanced directive?: No - Patient declined  Chief Complaint  Patient presents with  . Medical Management of Chronic Issues    Routine Visit/ [facility]    HPI:  82 yo female long term resident seen today for f/u. She c/o severe pain today reports she needs a pain pill. Per nursing, she has not asked for any pain med. She has hemorrhoidal discomfort but has not noticed any bleeding. No other concerns.  No falls. No nursing issues. She is a poor historian due to dementia. Hx obtained from chart  Chronic pain/FMS/RA/OA - right shoulder frozen and can only raise it about 30 degrees. She has neuropathy. Pain uncontrolled with prn dilaudid and q72hr fentanyl patch. She states her pain patch falls off easily. She is taking prednisone long term  HTN - stable on metoprolol. She has not req'd use of NTG for CP  Depression - stable on cymbalta  CKD - stable  - last Cr 0.8  GERD - stable on prilosec, miralax. She has hemorrhoids and takes dulcolax supp for constipation  Dysphagia - she completed modified barium swallow last month that revealed  mild-moderate oropharyngeal dysphagia. SLP recommended "Regular solids;Thin liquid Liquid Administration via Straw;Spoon Medication Administration Whole meds with puree Compensations Slow rate;Small sips/bites Postural Changes Remain semi-upright after after feeds/meals (Comment);Seated upright at 90 degrees"      Tremor/Parkinson's with  dementia/depression - on mirapex and stalevo as well as apokyn for parkinson's. Takes xanax TID for anxiety. cymbalta helps with depression. She is slowly declining  Recurrent UTI - recent UA revealed >100K E coli but she is asymptomatic. Family declines aggressive tx of asymptomatic UTI. [facility] provider aware  Allergic rhinitis - stable on zyrtec and flonase  She takes vitamins, minerals and other supplements. she also has nutritional supplements    Past Medical History  Diagnosis Date  . Localized superficial swelling, mass, or lump   . Paralysis agitans (HCC)   . Urinary tract infection, site not specified   . Allergic rhinitis, cause unspecified   . Diaphragmatic hernia without mention of obstruction or gangrene   . 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 Chronic kidney disease, unspecified (HCC)   . Unspecified constipation   . Myalgia and myositis, unspecified   . Peripheral vascular disease, unspecified (HCC)   . Senile osteoporosis   . GERD (gastroesophageal reflux disease)   . Major depressive disorder, recurrent episode, moderate (HCC)   . Hypertension   . Hyperlipidemia   . Parkinson disease (HCC)   . Degeneration of lumbar or lumbosacral intervertebral disc   . Rheumatoid arthritis(714.0)   . Generalized osteoarthrosis, involving multiple sites     Past Surgical History  Procedure Laterality Date  . Appendectomy      Patient Care Team: Kirt Boys, DO as PCP - General (Internal Medicine)  Social History   Social History  . Marital Status: Unknown  Spouse Name: N/A  . Number of Children: 6  . Years of Education: N/A   Occupational History  . Not on file.   Social History Main Topics  . Smoking status: Never Smoker   . Smokeless tobacco: Never Used  . Alcohol Use: No  . Drug Use: No  . Sexual Activity: Not on file   Other Topics Concern    . Not on file   Social History Narrative     reports that she has never smoked. She has never used smokeless tobacco. She reports that she does not drink alcohol or use illicit drugs.  No family history on file. Family Status  Relation Status Death Age  . Mother Alive     age 79  . Father Deceased      There is no immunization history on file for this patient.  Allergies  Allergen Reactions  . Ampicillin Other (See Comments)    MAR    Medications: Patient's Medications  New Prescriptions   No medications on file  Previous Medications   ALPRAZOLAM (XANAX) 0.25 MG TABLET    Take 0.25 mg by mouth 3 (three) times daily.   AMBULATORY NON FORMULARY MEDICATION    Magic Cup by mouth twice daily   AMBULATORY NON FORMULARY MEDICATION    Occusoft Baby eyelid towelette 1 wipe every night   APOMORPHINE HCL (APOKYN) 30 MG/3ML SOCT    Inject into the skin. Inject 0.2 ml (2mg ) SQ up to five times daily as needed for increased parkinson's symptoms   BISACODYL (DULCOLAX) 10 MG SUPPOSITORY    Place 10 mg rectally as needed for moderate constipation.   CARBIDOPA-LEVODOPA-ENTACAPONE (STALEVO 50 PO)    Take 1 tablet by mouth 3 (three) times daily.   CETIRIZINE (ZYRTEC) 10 MG TABLET    Take 10 mg by mouth daily.   CHOLECALCIFEROL (VITAMIN D3) 50000 UNITS TABS    Take by mouth. Take 1 tablet once a month   CRANBERRY-VITAMIN C-INULIN (UTI-STAT) LIQD    Take 30 mLs by mouth 2 (two) times daily.   DULOXETINE (CYMBALTA) 60 MG CAPSULE    Take 60 mg by mouth daily.   FENTANYL (DURAGESIC - DOSED MCG/HR) 25 MCG/HR PATCH    Apply one patch topically every 72 hours. Remove old patch before placement. Rotate sites   FLUTICASONE (FLONASE) 50 MCG/ACT NASAL SPRAY    Place 2 sprays into both nostrils daily.   GLUCOSAMINE-CHONDROITIN (MOVE FREE PO)    Take 1 tablet by mouth 3 (three) times daily.   HYDROMORPHONE (DILAUDID) 4 MG TABLET    Take one tablet by mouth every 8 hours as needed for pain.   MAGNESIUM  HYDROXIDE (MILK OF MAGNESIA PO)    Take 30 mLs by mouth as needed.   METOPROLOL SUCCINATE (TOPROL-XL) 25 MG 24 HR TABLET    Take 25 mg by mouth daily.   NITROGLYCERIN (NITROSTAT) 0.4 MG SL TABLET    Place 0.4 mg under the tongue every 5 (five) minutes as needed for chest pain.   NON FORMULARY    Take 120 mLs by mouth 2 (two) times daily. Med Pass   OMEGA-3 FATTY ACIDS (FISH OIL) 1000 MG CAPS    Take 1 capsule by mouth daily.   OMEPRAZOLE (PRILOSEC) 40 MG CAPSULE    Take 40 mg by mouth daily.   OYSTER SHELL 500 MG TABS    Take 500 mg by mouth 2 (two) times daily.   POLYETHYLENE GLYCOL POWDER (GLYCOLAX/MIRALAX) POWDER  Take 17 g by mouth 2 (two) times daily.   PRAMIPEXOLE (MIRAPEX) 1 MG TABLET    Take 1 mg by mouth 3 (three) times daily.   PREDNISOLONE 5 MG TABS TABLET    Take 5 mg by mouth daily.  Modified Medications   No medications on file  Discontinued Medications   ALBUTEROL (PROVENTIL) (2.5 MG/3ML) 0.083% NEBULIZER SOLUTION    Take 2.5 mg by nebulization every 8 (eight) hours as needed for wheezing or shortness of breath. Reported on 06/10/2015   APOMORPHINE HYDROCHLORIDE (APOKYN) 10 MG/ML SOLN    Inject 0.2 mLs into the skin. Reported on 06/10/2015   FOLIC ACID-VITAMIN B COMPLEX-VITAMIN C-SELENIUM-ZINC (DIALYVITE) 3 MG TABS TABLET    Take 1 tablet by mouth daily. Reported on 06/10/2015   LINIMENTS (SALONPAS PAIN RELIEF PATCH EX)    Apply topically as needed. Reported on 06/10/2015   NON FORMULARY    Reported on 06/10/2015   PROPYLENE GLYCOL (SYSTANE BALANCE) 0.6 % SOLN    Apply to eye. Reported on 06/10/2015   SENNOSIDES-DOCUSATE SODIUM (SENNA S PO)    Take by mouth. Reported on 06/10/2015    Review of Systems  Unable to perform ROS: Dementia    Filed Vitals:   06/10/15 1316  BP: 110/68  Pulse: 77  Temp: 97.3 F (36.3 C)  TempSrc: Oral  Resp: 17  Weight: 118 lb 9.6 oz (53.797 kg)   Body mass index is 19.74 kg/(m^2).  Physical Exam  Constitutional: She appears well-developed.  Frail  appearing in NAD, sitting in w/c at bedside  HENT:  Mouth/Throat: Oropharynx is clear and moist. No oropharyngeal exudate.  Eyes: Pupils are equal, round, and reactive to light. No scleral icterus.  Neck: Neck supple. Carotid bruit is not present. No tracheal deviation present. No thyromegaly present.  Cardiovascular: Normal rate, regular rhythm and intact distal pulses.  Exam reveals no gallop and no friction rub.   Murmur (1/6 SEM) heard. No LE edema b/l. no calf TTP.   Pulmonary/Chest: Effort normal and breath sounds normal. No stridor. No respiratory distress. She has no wheezes. She has no rales.  Scattered congested BS that clear with cough  Abdominal: Soft. Bowel sounds are normal. She exhibits no distension and no mass. There is no hepatomegaly. There is no tenderness. There is no rebound and no guarding.  Musculoskeletal: She exhibits edema (small and large joint deformities and swelling) and tenderness.  RUE swelling > LUE. Swelling most noted in right hand/fingers, elbow and shoulder/AC joint with resultant reduced ROM  Lymphadenopathy:    She has no cervical adenopathy.  Neurological: She is alert. She displays atrophy and tremor. She exhibits abnormal muscle tone.  Rigidity noted   Skin: Skin is warm and dry. No rash noted.  Psychiatric: She has a normal mood and affect. Her behavior is normal.     Labs reviewed: Nursing Home on 04/11/2015  Component Date Value Ref Range Status  . Hemoglobin 03/19/2015 12.4  12.0 - 16.0 g/dL Final  . HCT 07/68/0881 37  36 - 46 % Final  . Platelets 03/19/2015 284  150 - 399 K/L Final  . WBC 03/19/2015 7.1   Final  . Glucose 03/19/2015 118   Final  . BUN 03/19/2015 19  4 - 21 mg/dL Final  . Creatinine 11/05/5943 0.8  0.5 - 1.1 mg/dL Final  . Potassium 85/92/9244 4.6  3.4 - 5.3 mmol/L Final  . Sodium 03/19/2015 138  137 - 147 mmol/L Final  . Alkaline Phosphatase 03/19/2015  55  25 - 125 U/L Final  . ALT 03/19/2015 11  7 - 35 U/L Final  .  AST 03/19/2015 17  13 - 35 U/L Final  . Bilirubin, Total 03/19/2015 0.3   Final    No results found.   Assessment/Plan   ICD-9-CM ICD-10-CM   1. Chronic pain syndrome - uncontrolled  338.4 G89.4   2. Rheumatoid arthritis involving multiple joints (HCC) - probable flare 714.0 M06.9   3. Parkinson's disease (HCC) 332.0 G20   4. Dementia due to Parkinson's disease without behavioral disturbance (HCC) 332.0 G20    294.10 F02.80   5. Paralysis agitans (HCC) 332.0 G20   6. Essential hypertension 401.9 I10   7. Gastroesophageal reflux disease without esophagitis 530.81 K21.9   8. Major depressive disorder, recurrent episode, moderate (HCC) 296.32 F33.1   9. Seasonal allergic rhinitis due to pollen 477.0 J30.1     Encouraged her to take her pain medication as she has not been taking them per nursing  She will continue prednisone daily. T/c increasing med in light of RA flare  Cont other meds as ordered  PT/OT/ST as ordered  Nutritional supplements as ordered  Will follow   Zaid Tomes S. Ancil Linsey  [facility] [address] [place], [place] [address] [phone] Cell (Monday-Friday 8 AM - 5 PM) [phone] After 5 PM and follow prompts

## 2015-07-18 ENCOUNTER — Non-Acute Institutional Stay (SKILLED_NURSING_FACILITY): Payer: Medicare Other | Admitting: Internal Medicine

## 2015-07-18 ENCOUNTER — Encounter: Payer: Self-pay | Admitting: Internal Medicine

## 2015-07-18 DIAGNOSIS — Z79899 Other long term (current) drug therapy: Secondary | ICD-10-CM | POA: Diagnosis not present

## 2015-07-18 DIAGNOSIS — G20 Parkinson's disease: Secondary | ICD-10-CM | POA: Diagnosis not present

## 2015-07-18 DIAGNOSIS — M069 Rheumatoid arthritis, unspecified: Secondary | ICD-10-CM | POA: Diagnosis not present

## 2015-07-18 DIAGNOSIS — I10 Essential (primary) hypertension: Secondary | ICD-10-CM

## 2015-07-18 DIAGNOSIS — F331 Major depressive disorder, recurrent, moderate: Secondary | ICD-10-CM

## 2015-07-18 DIAGNOSIS — F0280 Dementia in other diseases classified elsewhere without behavioral disturbance: Secondary | ICD-10-CM | POA: Diagnosis not present

## 2015-07-18 NOTE — Progress Notes (Signed)
Patient ID: Sheri Molina, female   DOB: [birth year], 82 y.o.   MRN: [id number]    DATE: 07/18/15  Location:  Nursing Home Location: [facility]  Nursing Home Room Number: 127A Place of Service: SNF (31)   Extended Emergency Contact Information Primary Emergency Contact: Cisney,Joel Address: [address] [place], [place] [address] [place] Home Phone: [phone] Relation: Son  Advanced Directive information Does patient have an advance directive?: Yes, Type of Advance Directive: Out of facility DNR (pink MOST or yellow form), Does patient want to make changes to advanced directive?: No - Patient declined  Chief Complaint  Patient presents with  . Medical Management of Chronic Issues    Routine Visit/ [facility]    HPI:  82 yo female long term resident seen today for f/u. She c/o joint stiffness and resting tremor. She gets frustrated as someone has to help her to eat. She does not like to be the last one in the dining room eating. She saw neurology Dr Windle Guard and her Parkinson's med was adjusted. She is now taking stalevo at 75mg . No other concerns. No falls. No nursing issues. She is a poor historian due to dementia. Hx obtained from chart  Chronic pain/FMS/RA/OA - right shoulder frozen and can only raise it about 30 degrees. She has neuropathy. Pain uncontrolled with prn dilaudid and q72hr fentanyl patch. She states her pain patch falls off easily. She is taking prednisone long term  HTN - stable on metoprolol. She has not req'd use of NTG for CP  Depression - stable on cymbalta  CKD - stable  - last Cr 0.8  GERD - stable on prilosec, miralax. She has hemorrhoids and takes dulcolax supp for constipation  Dysphagia - she completed modified barium swallow in May 2017 that revealed  mild-moderate oropharyngeal dysphagia. SLP recommended "Regular solids;Thin liquid Liquid Administration via Straw;Spoon Medication Administration Whole meds with puree  Compensations Slow rate;Small sips/bites Postural Changes Remain semi-upright after after feeds/meals (Comment);Seated upright at 90 degrees"      Tremor/Parkinson's with dementia/depression - on mirapex and stalevo as well as apokyn for parkinson's. Takes xanax TID for anxiety. cymbalta helps with depression. She is slowly declining  Recurrent UTI - recent UA revealed >100K E coli but she is asymptomatic. Family declines aggressive tx of asymptomatic UTI. [facility] provider aware  Allergic rhinitis - stable on zyrtec and flonase  She takes vitamins, minerals and other supplements. she also has nutritional supplements  Past Medical History  Diagnosis Date  . Localized superficial swelling, mass, or lump   . Paralysis agitans (HCC)   . Urinary tract infection, site not specified   . Allergic rhinitis, cause unspecified   . Diaphragmatic hernia without mention of obstruction or gangrene   . 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 Chronic kidney disease, unspecified (HCC)   . Unspecified constipation   . Myalgia and myositis, unspecified   . Peripheral vascular disease, unspecified (HCC)   . Senile osteoporosis   . GERD (gastroesophageal reflux disease)   . Major depressive disorder, recurrent episode, moderate (HCC)   . Hypertension   . Hyperlipidemia   . Parkinson disease (HCC)   . Degeneration of lumbar or lumbosacral intervertebral disc   . Rheumatoid arthritis(714.0)   . Generalized osteoarthrosis, involving multiple sites     Past Surgical History  Procedure  Laterality Date  . Appendectomy      Patient Care Team: Kirt Boys, DO as PCP - General (Internal Medicine)  Social History   Social History  . Marital Status: Unknown    Spouse Name: N/A  . Number of Children: 6  . Years of Education: N/A   Occupational History  . Not on file.   Social History Main  Topics  . Smoking status: Never Smoker   . Smokeless tobacco: Never Used  . Alcohol Use: No  . Drug Use: No  . Sexual Activity: Not on file   Other Topics Concern  . Not on file   Social History Narrative     reports that she has never smoked. She has never used smokeless tobacco. She reports that she does not drink alcohol or use illicit drugs.  History reviewed. No pertinent family history. Family Status  Relation Status Death Age  . Mother Alive     age 33  . Father Deceased      There is no immunization history on file for this patient.  Allergies  Allergen Reactions  . Ampicillin Other (See Comments)    MAR    Medications: Patient's Medications  New Prescriptions   No medications on file  Previous Medications   ALPRAZOLAM (XANAX) 0.25 MG TABLET    Take 0.25 mg by mouth 3 (three) times daily as needed.    AMBULATORY NON FORMULARY MEDICATION    Magic Cup by mouth twice daily   AMBULATORY NON FORMULARY MEDICATION    Occusoft Baby eyelid towelette 1 wipe every night   APOMORPHINE HCL (APOKYN) 30 MG/3ML SOCT    Inject into the skin. Inject 0.2 ml ( ) SQ up to five times daily as needed for increased parkinson's symptoms   BISACODYL (DULCOLAX) 10 MG SUPPOSITORY    Place 10 mg rectally as needed for moderate constipation.   CARBIDOPA-LEVODOPA-ENTACAPONE PO    Take 75 mg by mouth 3 (three) times daily.   CETIRIZINE (ZYRTEC) 10 MG TABLET    Take 10 mg by mouth daily.   CHOLECALCIFEROL (VITAMIN D3) 50000 UNITS TABS    Take by mouth. Take 1 tablet once a month   CRANBERRY-VITAMIN C-INULIN (UTI-STAT) LIQD    Take 30 mLs by mouth 2 (two) times daily.   DULOXETINE (CYMBALTA) 60 MG CAPSULE    Take 60 mg by mouth daily.   FENTANYL (DURAGESIC - DOSED MCG/HR) 25 MCG/HR PATCH    Apply one patch topically every 72 hours. Remove old patch before placement. Rotate sites   FLUTICASONE (FLONASE) 50 MCG/ACT NASAL SPRAY    Place 2 sprays into both nostrils daily.    GLUCOSAMINE-CHONDROITIN (MOVE FREE PO)    Take 1 tablet by mouth 3 (three) times daily.   HYDROCORTISONE (ANUSOL-HC) 2.5 % RECTAL CREAM    Place 1 application rectally daily as needed for hemorrhoids or itching.   HYDROMORPHONE (DILAUDID) 4 MG TABLET    Take one tablet by mouth every 8 hours as needed for pain.   MAGNESIUM HYDROXIDE (MILK OF MAGNESIA PO)    Take 30 mLs by mouth as needed.   METOPROLOL SUCCINATE (TOPROL-XL) 25 MG 24 HR TABLET    Take 25 mg by mouth daily.   NITROGLYCERIN (NITROSTAT) 0.4 MG SL TABLET    Place 0.4 mg under the tongue every 5 (five) minutes as needed for chest pain.   NON FORMULARY    Take 120 mLs by mouth 2 (two) times daily. Med Pass   OMEGA-3 FATTY  ACIDS (FISH OIL) 1000 MG CAPS    Take 1 capsule by mouth daily.   OMEPRAZOLE (PRILOSEC) 40 MG CAPSULE    Take 40 mg by mouth daily.   OYSTER SHELL 500 MG TABS    Take 500 mg by mouth 2 (two) times daily.   PIMAVANSERIN TARTRATE (NUPLAZID) 17 MG TABS    Take 1 tablet by mouth daily.   POLYETHYLENE GLYCOL POWDER (GLYCOLAX/MIRALAX) POWDER    Take 17 g by mouth 2 (two) times daily.   PRAMIPEXOLE (MIRAPEX) 1 MG TABLET    Take 1 mg by mouth 3 (three) times daily.   PREDNISOLONE 5 MG TABS TABLET    Take 5 mg by mouth daily.  Modified Medications   No medications on file  Discontinued Medications   CARBIDOPA-LEVODOPA-ENTACAPONE (STALEVO 50 PO)    Take 1 tablet by mouth 3 (three) times daily. Reported on 07/18/2015    Review of Systems  Unable to perform ROS: Dementia    Filed Vitals:   07/18/15 1307  BP: 138/64  Pulse: 67  Temp: 98.2 F (36.8 C)  TempSrc: Oral  Resp: 18  Height: 5\' 5"  (1.651 m)  Weight: 123 lb 3.2 oz (55.883 kg)   Body mass index is 20.5 kg/(m^2).  Physical Exam  Constitutional: She appears well-developed.  Frail appearing in NAD, sitting in w/c at bedside  HENT:  Mouth/Throat: Oropharynx is clear and moist. No oropharyngeal exudate.  Eyes: Pupils are equal, round, and reactive to light. No  scleral icterus.  Neck: Neck supple. Carotid bruit is not present. No tracheal deviation present. No thyromegaly present.  Cardiovascular: Normal rate, regular rhythm and intact distal pulses.  Exam reveals no gallop and no friction rub.   Murmur (1/6 SEM) heard. No LE edema b/l. no calf TTP.   Pulmonary/Chest: Effort normal and breath sounds normal. No stridor. No respiratory distress. She has no wheezes. She has no rales. She exhibits no tenderness.  Left ACW fentanyl patch present  Abdominal: Soft. Bowel sounds are normal. She exhibits no distension and no mass. There is no hepatomegaly. There is no tenderness. There is no rebound and no guarding.  Musculoskeletal: She exhibits edema (small and large joint deformities and swelling) and tenderness.  RUE swelling > LUE. Swelling most noted in right hand/fingers, elbow and shoulder/AC joint with resultant reduced ROM  Lymphadenopathy:    She has no cervical adenopathy.  Neurological: She is alert. She displays atrophy and tremor. She exhibits abnormal muscle tone.  Rigidity noted   Skin: Skin is warm and dry. No rash noted.  Psychiatric: She has a normal mood and affect. Her behavior is normal.     Labs reviewed: No visits with results within 3 Month(s) from this visit. Latest known visit with results is:  Nursing Home on 04/11/2015  Component Date Value Ref Range Status  . Hemoglobin 03/19/2015 12.4  12.0 - 16.0 g/dL Final  . HCT 03/21/2015 37  36 - 46 % Final  . Platelets 03/19/2015 284  150 - 399 K/L Final  . WBC 03/19/2015 7.1   Final  . Glucose 03/19/2015 118   Final  . BUN 03/19/2015 19  4 - 21 mg/dL Final  . Creatinine 03/21/2015 0.8  0.5 - 1.1 mg/dL Final  . Potassium 41/66/0630 4.6  3.4 - 5.3 mmol/L Final  . Sodium 03/19/2015 138  137 - 147 mmol/L Final  . Alkaline Phosphatase 03/19/2015 55  25 - 125 U/L Final  . ALT 03/19/2015 11  7 -  35 U/L Final  . AST 03/19/2015 17  13 - 35 U/L Final  . Bilirubin, Total 03/19/2015  0.3   Final    No results found.   Assessment/Plan   ICD-9-CM ICD-10-CM   1. Parkinson's disease (HCC) 332.0 G20   2. Rheumatoid arthritis involving multiple joints (HCC) 714.0 M06.9   3. Dementia due to Parkinson's disease without behavioral disturbance (HCC) 332.0 G20    294.10 F02.80   4. Paralysis agitans (HCC) 332.0 G20   5. Essential hypertension 401.9 I10   6. Major depressive disorder, recurrent episode, moderate (HCC) 296.32 F33.1   7. Encounter for long-term (current) use of high-risk medication V58.69 Z79.899     Check CBC, BMP and ALT  Cont current meds as ordered  F/u with neuro Dr Windle Guard as scheduled  PT/OT/ST as indicated  [facility] NP to follow  Will follow   Mandela Bello S. Ancil Linsey  [facility] [address] [place], [place] [address] [phone] Cell (Monday-Friday 8 AM - 5 PM) [phone] After 5 PM and follow prompts

## 2015-07-19 LAB — BASIC METABOLIC PANEL
BUN: 21 mg/dL (ref 4–21)
Creatinine: 0.8 mg/dL (ref 0.5–1.1)
Glucose: 123 mg/dL
Potassium: 4.5 mmol/L (ref 3.4–5.3)
SODIUM: 140 mmol/L (ref 137–147)

## 2015-07-19 LAB — CBC AND DIFFERENTIAL
HEMATOCRIT: 41 % (ref 36–46)
Hemoglobin: 13.6 g/dL (ref 12.0–16.0)
Platelets: 282 10*3/uL (ref 150–399)
WBC: 10.8 10^3/mL

## 2015-07-19 LAB — HEPATIC FUNCTION PANEL: ALT: 13 U/L (ref 7–35)

## 2015-09-12 ENCOUNTER — Non-Acute Institutional Stay (SKILLED_NURSING_FACILITY): Payer: Medicare Other | Admitting: Internal Medicine

## 2015-09-12 ENCOUNTER — Encounter: Payer: Self-pay | Admitting: Internal Medicine

## 2015-09-12 DIAGNOSIS — I10 Essential (primary) hypertension: Secondary | ICD-10-CM | POA: Diagnosis not present

## 2015-09-12 DIAGNOSIS — G894 Chronic pain syndrome: Secondary | ICD-10-CM | POA: Diagnosis not present

## 2015-09-12 DIAGNOSIS — K219 Gastro-esophageal reflux disease without esophagitis: Secondary | ICD-10-CM | POA: Diagnosis not present

## 2015-09-12 NOTE — Progress Notes (Signed)
[facility] [address] Sheri Melnick, MD  [address] [place] [address]  Chief Complaint  Patient presents with  . Medical Management of Chronic Issues    Medical Management of Chronic Issues   Allergies  Allergen Reactions  . Ampicillin Other (See Comments)    MAR    This is a nursing facility follow up of chronic medical diagnoses  Interim medical record and care since last [facility] visit was updated with review of diagnostic studies and change in clinical status since last visit were documented.  HPI: She was seen in routine follow-up but she describesd a significant episode of diffuse pain 09/10/15. She described severe pain from the left ear down to the toes, worse in the left shoulder lasting almost 36 hours. She was medicated for the pain. She states she has had not had severe pain to this degree since she "crushed" her shoulder. There was no associated chest pain with this episode or other cardiopulmonary symptoms. She is on a fentanyl patch every 72 hours as well as Dilaudid 4 mg every 8 hours as needed. Most recent labs were 07/19/15. Glucose was mildly elevated at 123 but otherwise blood count and chemistries were normal.  Review of systems: She describes intermittent dizziness without specific neurologic or cardiac prodrome. It can occur even sitting. It can be associated with headache but she has unrelated headaches which "encircle" her head. Denied were any change in heart rhythm or rate prior to the dizziness. There was no associated chest pain or shortness of breath . Also specifically denied prior to the episodes were  limb weakness, tingling, or numbness. No seizure activity noted. She does have remittent dysphagia and has been diagnosed with moderate pharyngeal dysphagia with moderate aspiration risk.  She has urinary frequency without other genitourinary symptoms. Intermittent rectal bleeding from hemorrhoids. She  describes edema of the hands. She describes chronic pain in upper extremities, left upper extremity greater than right.  Physical exam:  Pertinent or positive findings: She appears her stated age and is very frail. She sits in a wheelchair. Anisocoria is present, right pupil larger than the left. She exhibits torticollis of the neck to the left. Her thorax actually sits on her hips. She has dry rub-like rales at the bases. She has classic rheumatoid arthritis changes in the hands with contractions and lateral deviation. There is marked decreased range of motion of the upper extremities, particularly on the left. Pedal pulses are decreased. She has scattered senile keratoses over the trunk. There is a visible varicosity over the right chest.  General appearance:Adequately nourished; no acute distress , increased work of breathing is present.   Lymphatic: No lymphadenopathy about the head, neck, axilla . Eyes: No conjunctival inflammation or lid edema is present. There is no scleral icterus. Ears:  External ear exam shows no significant lesions or deformities.   Nose:  External nasal examination shows no deformity or inflammation. Nasal mucosa are pink and moist without lesions ,exudates Oral exam: lips and gums are healthy appearing.There is no oropharyngeal erythema or exudate . Neck:  No thyromegaly, masses, tenderness noted.    Heart:  Normal rate and regular rhythm. S1 and S2 normal without gallop, murmur, click, rub .  Abdomen:Bowel sounds are normal. Abdomen is soft and nontender with no organomegaly, hernias,masses. GU: deferred  Extremities:  No cyanosis, clubbing,edema  Neurologic exam : Strength markedlydecreased  in upper & lower extremities Balance,Rhomberg,finger to nose testing could not be  completed due to clinical state Deep tendon reflexes are equal Skin: Warm & dry w/o tenting. No significant lesions or rash.    See summary under each active problem in the Problem  List with associated updated therapeutic plan

## 2015-09-12 NOTE — Patient Instructions (Signed)
No new orders If food does hang up or stick frequently; gastroenterology evaluation necessary to rule out stricture. Such is usually due to chronic reflux of stomach acid. Reflux of gastric acid may be asymptomatic as this may occur mainly during sleep.The triggers for reflux  include stress; the "aspirin family" ; alcohol; peppermint; and caffeine (coffee, tea, cola, and chocolate). The aspirin family would include aspirin and the nonsteroidal agents such as ibuprofen &  Naproxen. Tylenol would not cause reflux. If having symptoms ; food & drink should be avoided for @ least 2 hours before going to bed.

## 2015-09-12 NOTE — Assessment & Plan Note (Signed)
Blood pressure control is good BMET normal No change in therapy recommended

## 2015-09-12 NOTE — Assessment & Plan Note (Signed)
See 09/12/15 description of 36 hour episode of left-sided pain from her ear to her toes. Description does not fit any particular process but may represent neuropathic pain related to her advanced rheumatologic condition She certainly has adequate pain medication with fentanyl and Dilaudid. Should this regimen be inadequate, she should be referred to a chronic pain center because of the risk of respiratory depression.

## 2015-09-12 NOTE — Assessment & Plan Note (Addendum)
Interventions to prevent aspiration discussed with the patient

## 2015-11-18 ENCOUNTER — Other Ambulatory Visit: Payer: Self-pay | Admitting: *Deleted

## 2015-11-18 MED ORDER — FENTANYL 25 MCG/HR TD PT72: MEDICATED_PATCH | TRANSDERMAL | 0 refills | Status: DC

## 2015-11-18 NOTE — Telephone Encounter (Signed)
[facility] [phone] Fax: [phone]  

## 2015-12-19 ENCOUNTER — Encounter: Payer: Self-pay | Admitting: Internal Medicine

## 2015-12-19 ENCOUNTER — Non-Acute Institutional Stay (SKILLED_NURSING_FACILITY): Payer: Medicare Other | Admitting: Internal Medicine

## 2015-12-19 DIAGNOSIS — G20 Parkinson's disease: Secondary | ICD-10-CM | POA: Diagnosis not present

## 2015-12-19 DIAGNOSIS — I10 Essential (primary) hypertension: Secondary | ICD-10-CM | POA: Diagnosis not present

## 2015-12-19 DIAGNOSIS — G8929 Other chronic pain: Secondary | ICD-10-CM | POA: Diagnosis not present

## 2015-12-19 DIAGNOSIS — R229 Localized swelling, mass and lump, unspecified: Secondary | ICD-10-CM | POA: Insufficient documentation

## 2015-12-19 DIAGNOSIS — Z9189 Other specified personal risk factors, not elsewhere classified: Secondary | ICD-10-CM | POA: Insufficient documentation

## 2015-12-19 DIAGNOSIS — R1013 Epigastric pain: Secondary | ICD-10-CM

## 2015-12-19 NOTE — Assessment & Plan Note (Signed)
Escalation of her pain medications must be avoided because of the high risk of respiratory depression

## 2015-12-19 NOTE — Assessment & Plan Note (Signed)
Plan: CBC & dif Lipase Hepatic panel

## 2015-12-19 NOTE — Progress Notes (Signed)
    Facility Location: [facility]  Room Number:127 A  Code Status: DNR  This is a nursing facility follow up of chronic medical diagnoses  Interim medical record and care since last [facility] visit was updated with review of diagnostic studies and change in clinical status since last visit were documented.  HPI: She has Parkinson's disease & been diagnosed with Parkinson's associated psychoses. Seroquel was discontinued as was as needed Xanax. Dose of Nuplazid was increased 12/10/15 to 34 mg by the psychiatric nurse practitioner. The patient had been followed by Dwaine Gale neurologist in [place], [place], but she was last seen in June 2016. Her major issues are "cramping of my whole body with bones dragging, especially in the neck".She attributes these symptoms to her Parkinson's. The patient has chronic pain syndrome and is on Fentanyl every 72 hours as well as Dilaudid 4 mg every 8 hours as needed Medical diagnoses include peripheral vascular disease, hypertension, GERD with dysphagia, peripheral neuropathy, rheumatoid arthritis ,dyslipidemia and depression. The patient is on polypharmacy including daily prednisone. She's had no labs since 7/14.Marland Kitchen She had mild hyperglycemia at that time.  Review of systems: She describes recurrent headaches over the last several months. She also mentions sharp pain across the abdomen over the last 6 months. She describes some dysphagia. She's also had some constipation which resolved  "when hemorrhoids flared". She is not having rectal bleeding, melena, or persistent weight loss. She describes pain in her feet & has requested ports on her chair.  Physical exam:  Pertinent or positive findings: She is nonambulatory, sitting in wheelchair. Her head is deviated to the left due to torticollis. The neck muscles on the right are tense. There is asymmetry of the nasolabial folds, the left is decreased. She has  minor scattered rales on auscultation of the chest. There is no increased work of breathing. She has trace-1/2+ edema at the sock line. She is diffusely weak. Hands reveal severe rheumatoid changes with contractions and deformities.   General appearance:thin but adequately nourished; no acute distress , increased work of breathing is present.   Lymphatic: No lymphadenopathy about the head, neck, axilla . Eyes: No conjunctival inflammation or lid edema is present. There is no scleral icterus. Ears:  External ear exam shows no significant lesions or deformities.   Nose:  External nasal examination shows no deformity or inflammation. Nasal mucosa are pink and moist without lesions ,exudates Oral exam: lips and gums are healthy appearing.There is no oropharyngeal erythema or exudate . Neck:  No thyromegaly, masses, tenderness noted.    Heart:  Normal rate and regular rhythm. S1 and S2 normal without gallop, murmur, click, rub .  Abdomen:Bowel sounds are normal. Abdomen is soft and nontender with no organomegaly, hernias,masses. GU: deferred  Extremities:  No cyanosis, clubbing  Strength equal  in upper & lower extremities Balance,Rhomberg,finger to nose testing could not be completed due to clinical state Deep tendon reflexes are equal but 0+ @ knees Skin: Warm & dry w/o tenting. No significant lesions or rash.    See summary under each active problem in the Problem List with associated updated therapeutic plan

## 2015-12-19 NOTE — Assessment & Plan Note (Signed)
BMET BP controlled; no change in antihypertensive medications

## 2015-12-19 NOTE — Patient Instructions (Signed)
See Current Assessment & Plan in Problem List under specific Diagnosis 

## 2015-12-19 NOTE — Assessment & Plan Note (Signed)
Subjective diffuse cramping attributed to the Parkinson's by the patient Neurology reassessment clinically indicated

## 2015-12-20 LAB — CBC AND DIFFERENTIAL
HEMATOCRIT: 40 % (ref 36–46)
Hemoglobin: 13.3 g/dL (ref 12.0–16.0)
Platelets: 220 10*3/uL (ref 150–399)
WBC: 5.8 10^3/mL

## 2015-12-20 LAB — BASIC METABOLIC PANEL
BUN: 17 mg/dL (ref 4–21)
Creatinine: 0.7 mg/dL (ref 0.5–1.1)
GLUCOSE: 84 mg/dL
Sodium: 142 mmol/L (ref 137–147)

## 2016-01-10 IMAGING — CT CT ABD-PELV W/O CM
1 of 2 series · 15 of 32 positions shown, 19 images · non-contrast
Comparison: None.

CLINICAL DATA: 84-year-old female with abdominal and pelvic pain.

EXAM:
CT ABDOMEN AND PELVIS WITHOUT CONTRAST
TECHNIQUE: Multidetector CT imaging of the abdomen and pelvis was performed
following the standard protocol without IV contrast. Please note
that due to technical difficulties, no reformatted images could be
generated.

[Series 2: abd/ pelvis 5.0 i30f 1 · axial · 0.70mm/px · z∈[+712,+1022]mm · 15 of 68 slices shown, 19 images]
[im 3/68  soft-tissue]
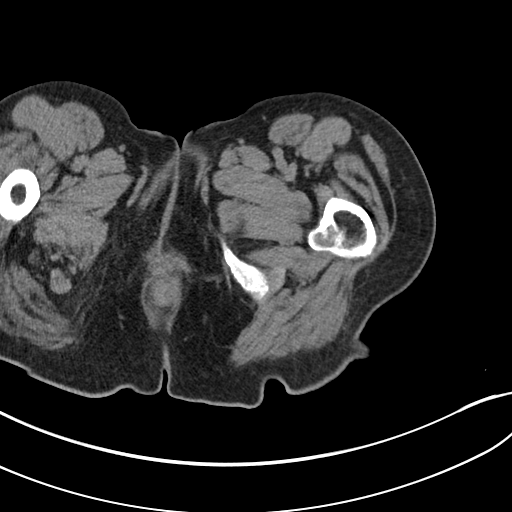
[im 3/68  bone]
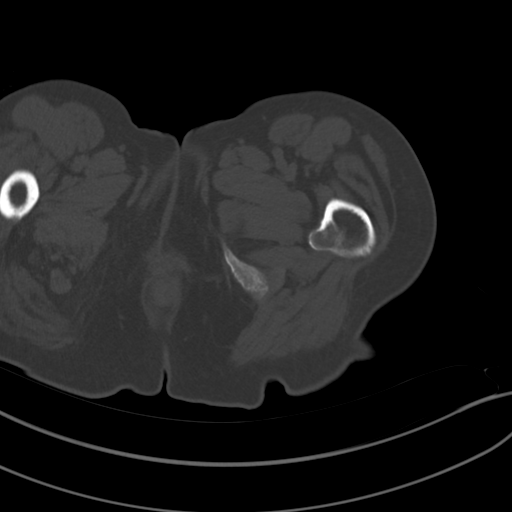
[im 9/68  soft-tissue]
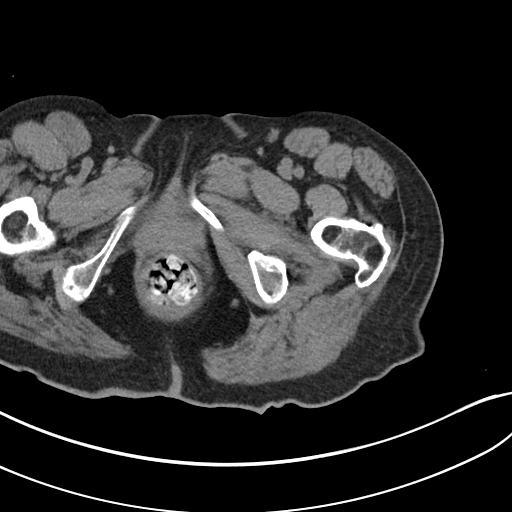
[im 14/68  soft-tissue]
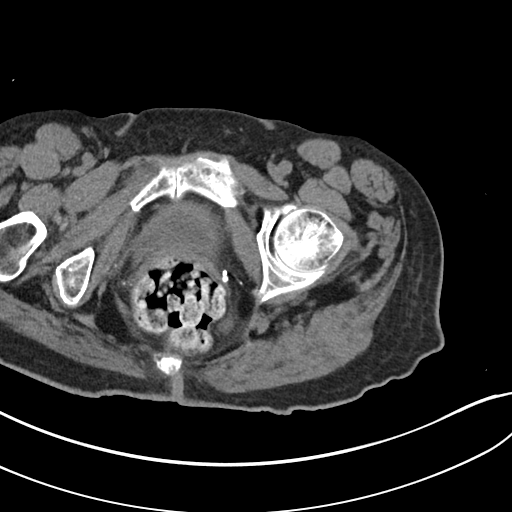
[im 19/68  soft-tissue]
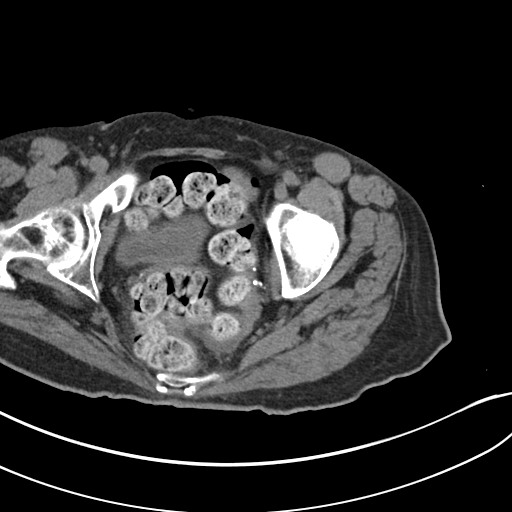
[im 25/68  soft-tissue]
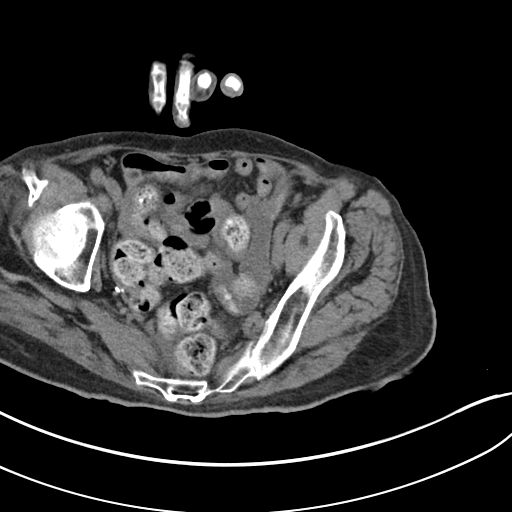
[im 30/68  soft-tissue]
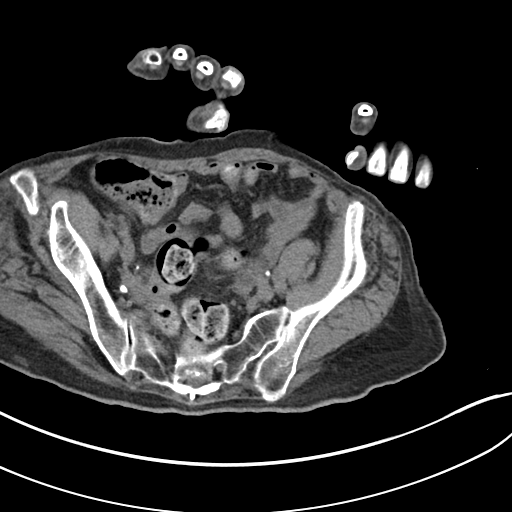
[im 35/68  soft-tissue]
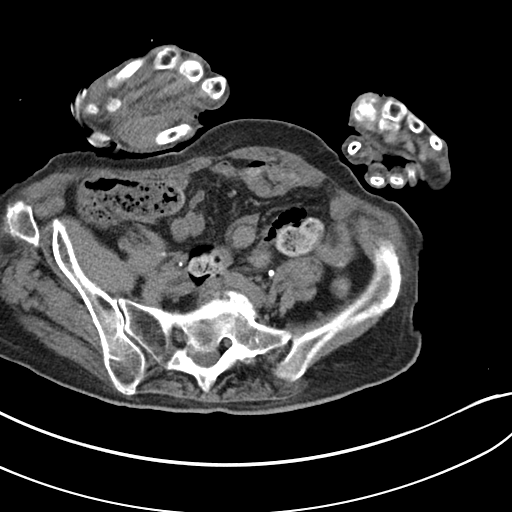
[im 38/68  soft-tissue]
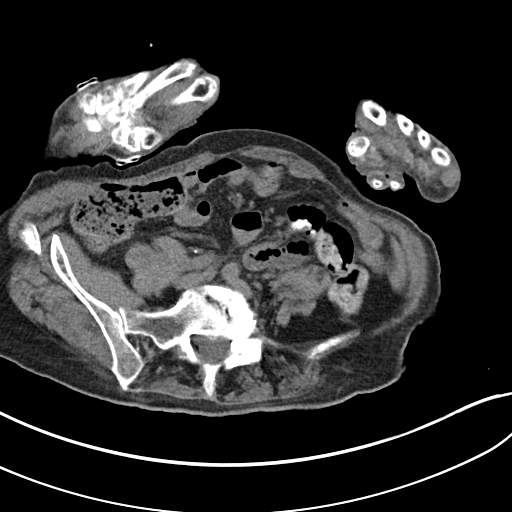
[im 43/68  soft-tissue]
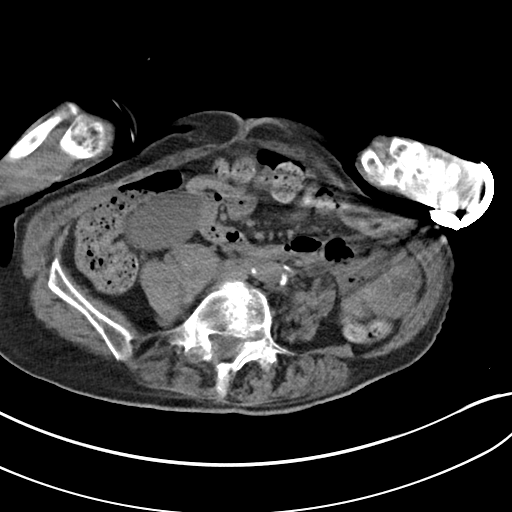
[im 43/68  bone]
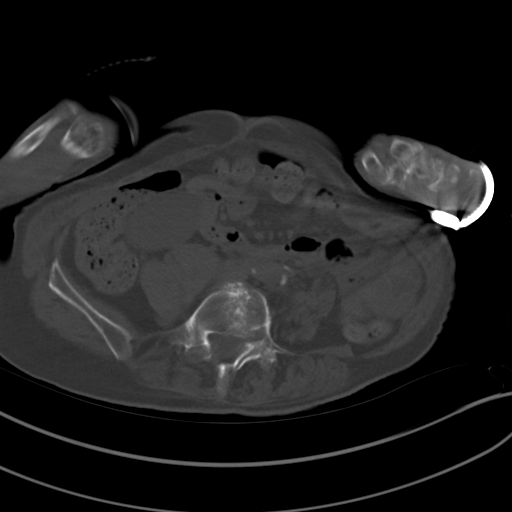
[im 49/68  soft-tissue]
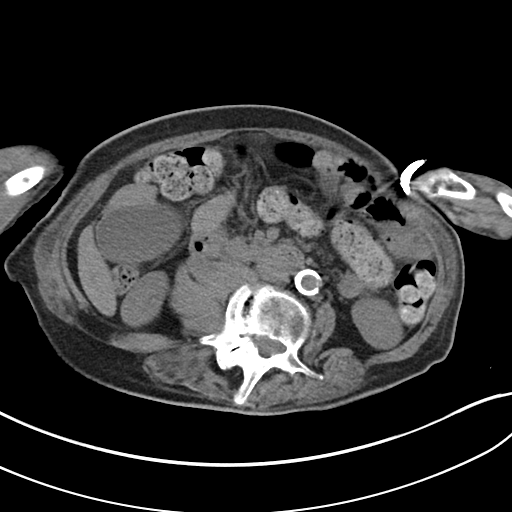
[im 54/68  soft-tissue]
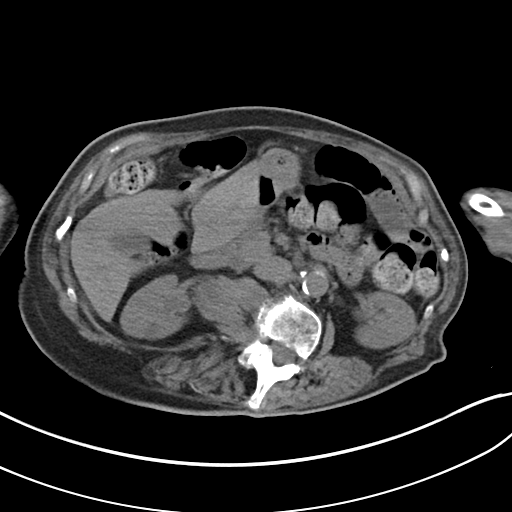
[im 57/68  lung]
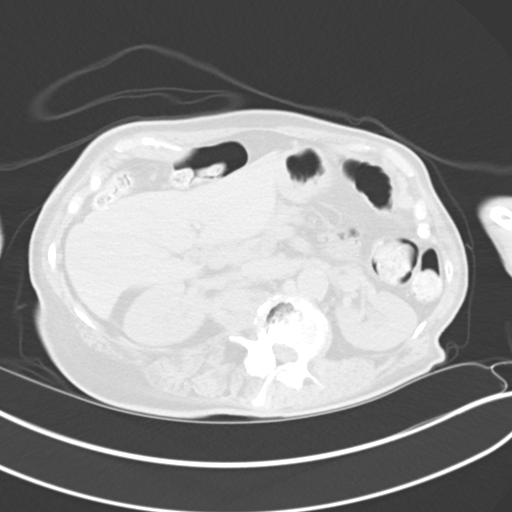
[im 59/68  soft-tissue]
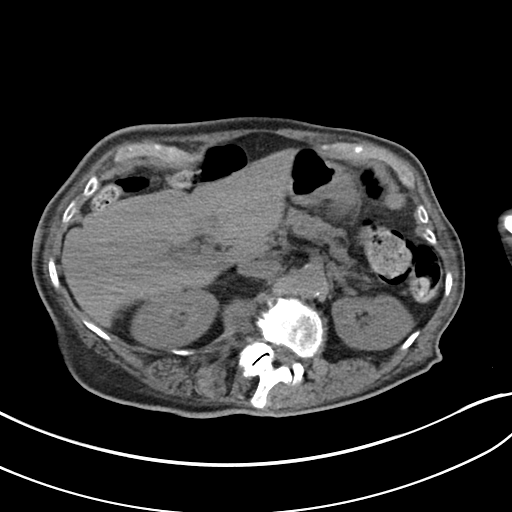
[im 59/68  lung]
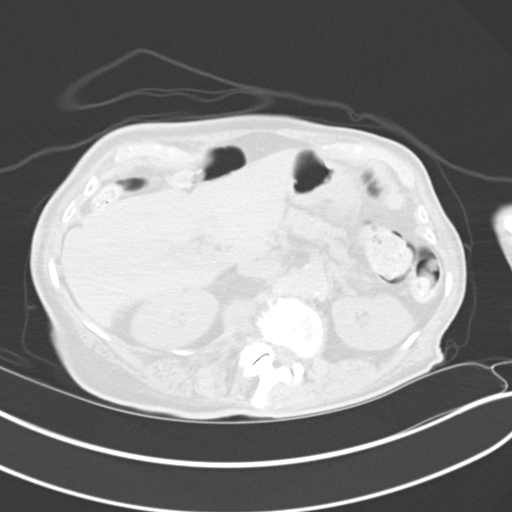
[im 62/68  lung]
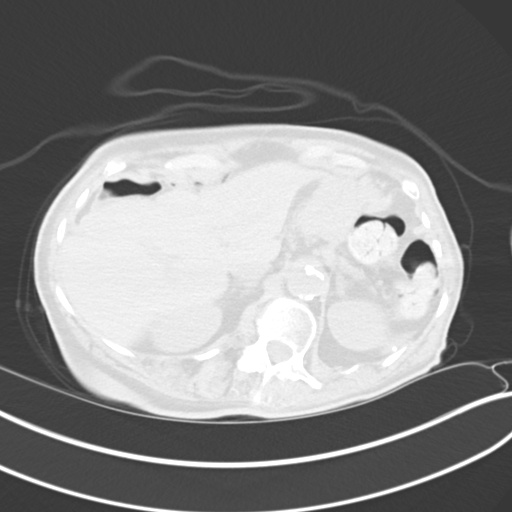
[im 65/68  soft-tissue]
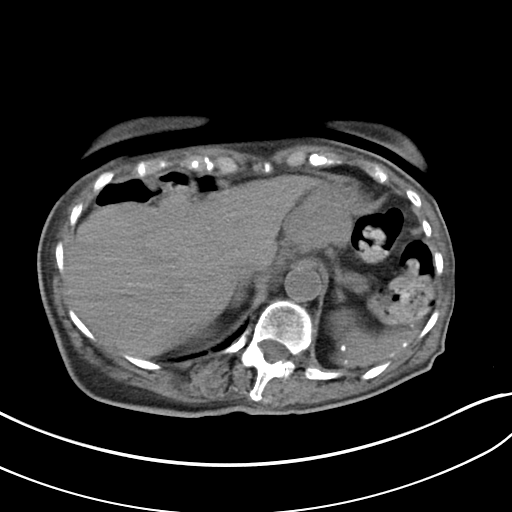
[im 65/68  lung]
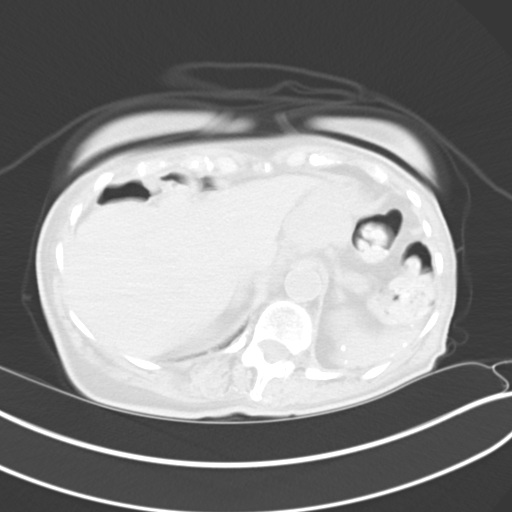

[15 of 32 positions shown; findings below may reference images not displayed]

FINDINGS: Please note that parenchymal abnormalities may be missed without
intravenous contrast.

Lower chest:  Mild bibasilar atelectasis/scarring is noted.

Hepatobiliary: The visualized liver is unremarkable. The gallbladder
is distended. No definite biliary dilatation identified.

Pancreas: Unremarkable

Spleen: Unremarkable except for calcified granulomatous disease.

Adrenals/Urinary Tract: The kidneys, adrenal glands and bladder are
unremarkable.

Stomach/Bowel: A moderate amount of stool within the colon and
rectum noted. There is no evidence bowel obstruction or
pneumoperitoneum.

Vascular/Lymphatic: No enlarged lymph nodes or abdominal aortic
aneurysm.

Reproductive: Patient is status post hysterectomy. No adnexal masses
identified.

Other: Is small amount of free fluid within the pelvis is noted.

Musculoskeletal: A moderate to severe lumbar scoliosis and
degenerative changes identified. No acute bony abnormalities are
noted.
IMPRESSION: Small amount of free pelvic fluid -nonspecific.

Distended gallbladder without other CT evidence of acute
cholecystitis. Consider ultrasound as clinically indicated.

Moderate colonic and rectal stool.

Bibasilar atelectasis/scarring.

## 2016-01-15 IMAGING — CR DG CHEST 2V
2 series · 2 of 2 positions shown · non-contrast
Comparison: 02/04/2014

CLINICAL DATA: Anterior chest pain radiating towards the left.
Hypertension.

EXAM:
CHEST  2 VIEW

[w chest lat]
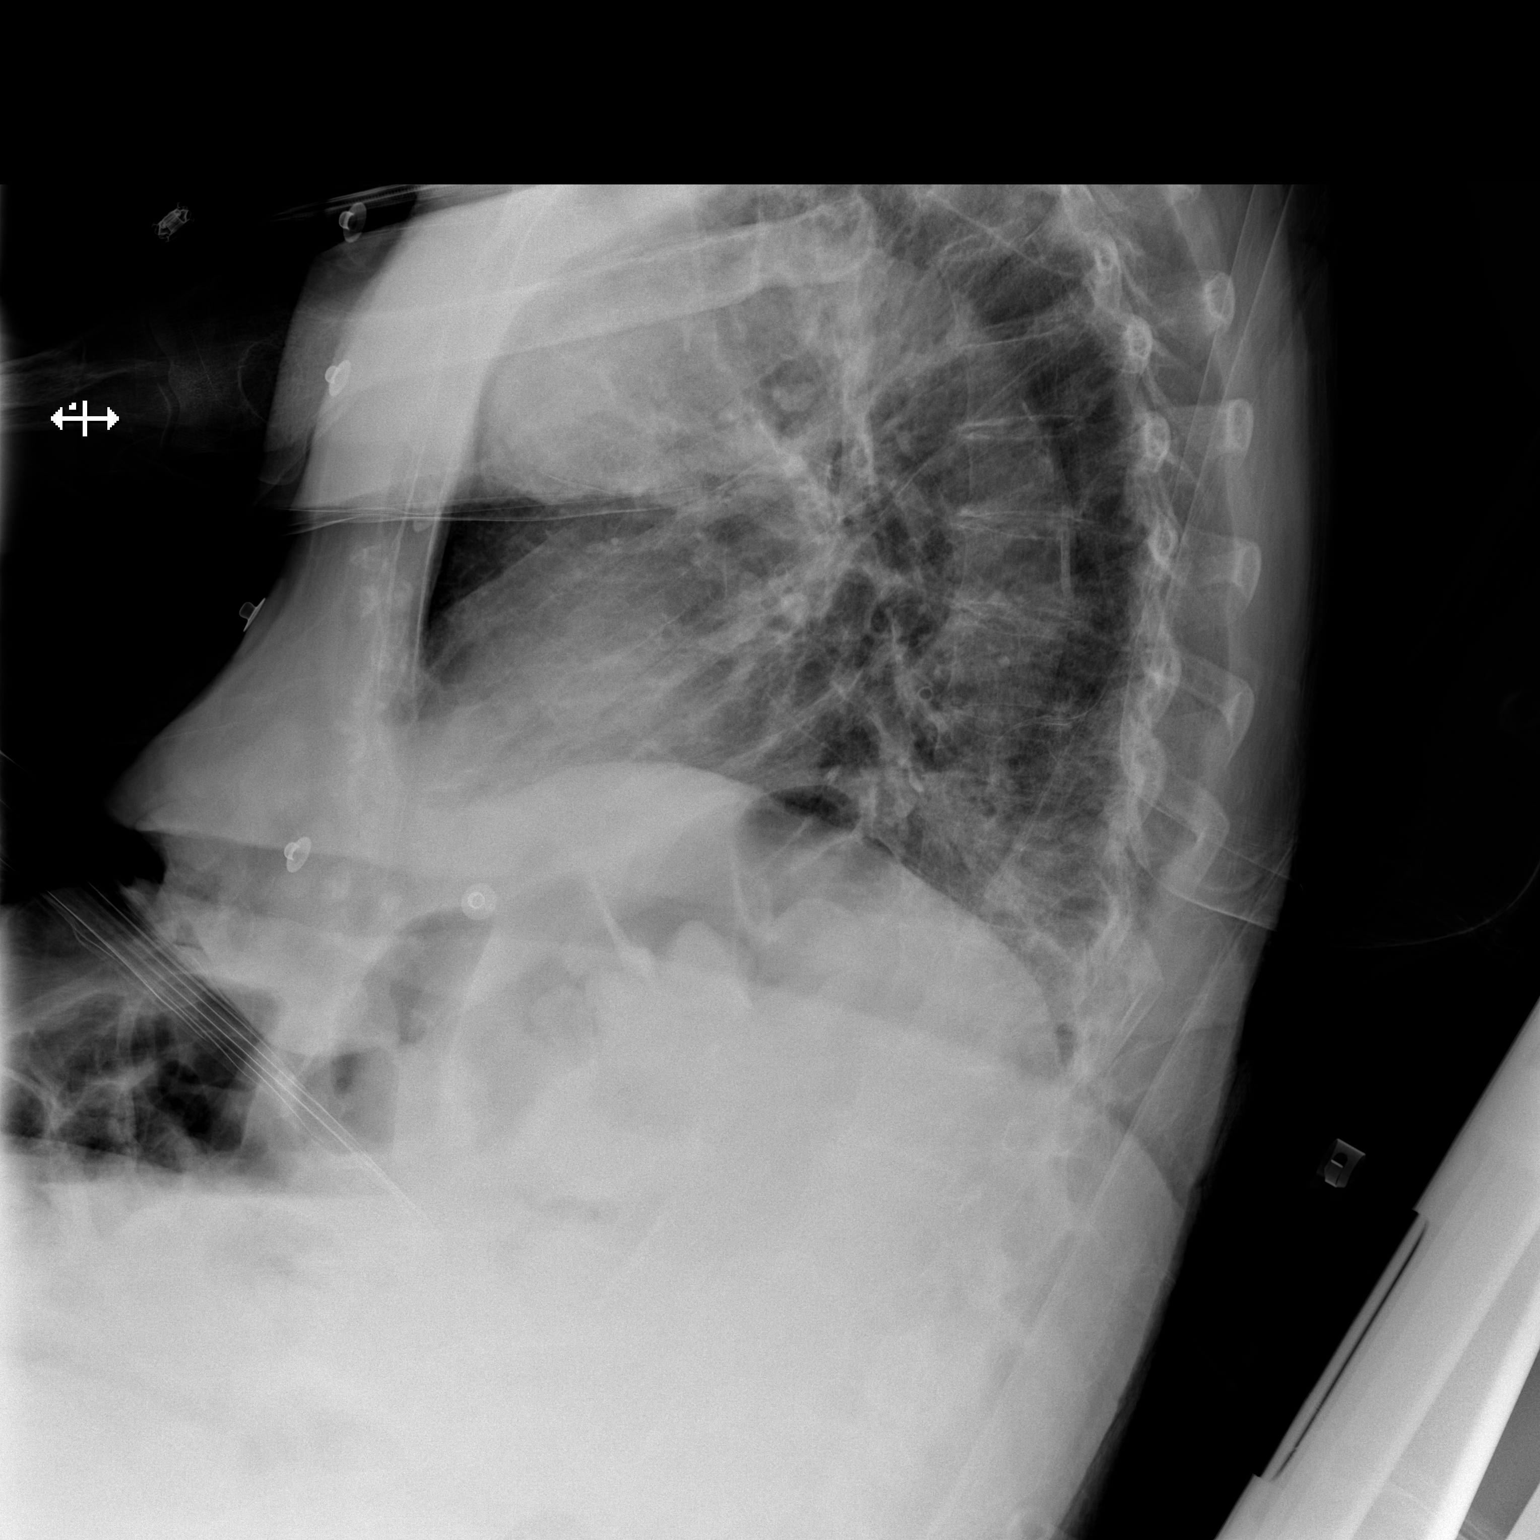

[x chest ap]
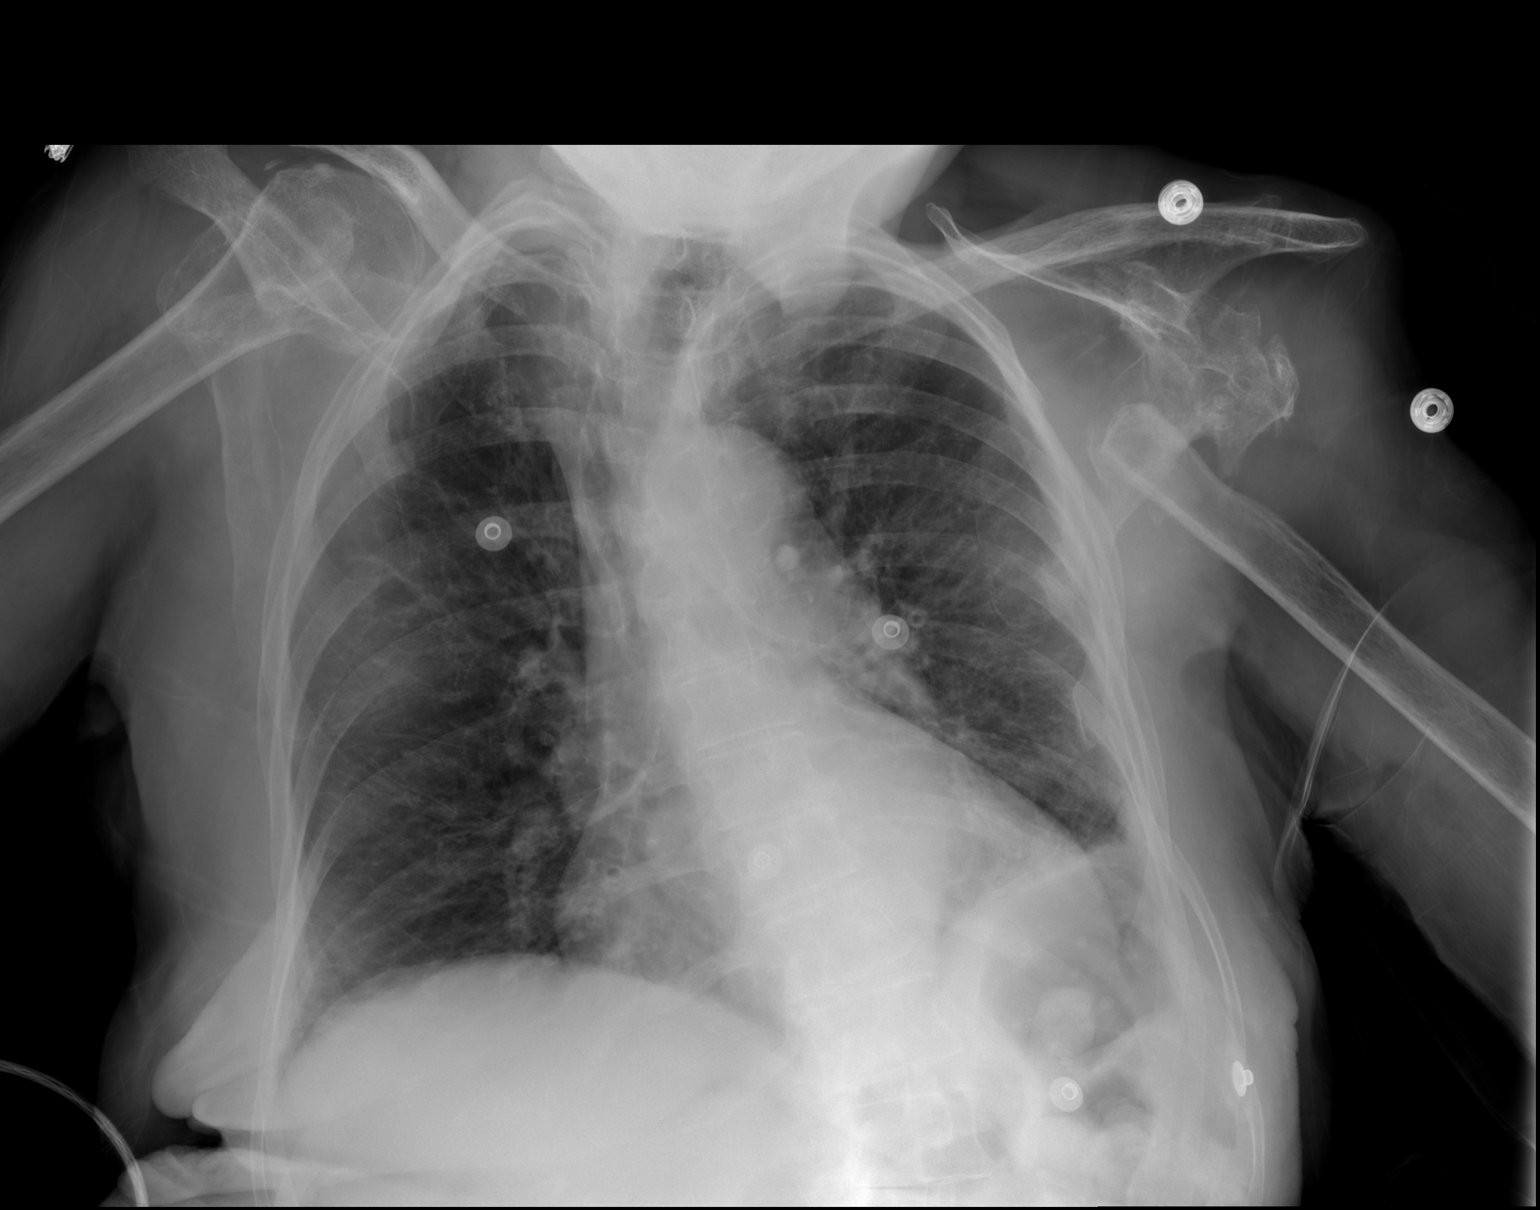

[2 of 2 positions shown; findings below may reference images not displayed]

FINDINGS: Right humeral head dislocated medially. Left humeral head absent,
with left proximal humeral shaft dislocated medially and with
deformity of the left glenoid.

Old granulomatous disease noted. Mild enlargement of the
cardiopericardial silhouette.

Subsegmental atelectasis at both lung bases. Airway thickening is
present, suggesting bronchitis or reactive airways disease.
Tortuosity and atherosclerotic calcification of the thoracic aorta.
No pulmonary edema.
IMPRESSION: 1. Cardiomegaly, without edema.
2. Airway thickening is present, suggesting bronchitis or reactive
airways disease.
3. Right glenohumeral joint dislocation, chronic.
4. Chronic osteolysis of the left humeral head and chronic
dislocation of the left humeral shaft with respect to the deformed
left glenoid. The

## 2016-01-31 ENCOUNTER — Other Ambulatory Visit: Payer: Self-pay

## 2016-01-31 MED ORDER — FENTANYL 25 MCG/HR TD PT72: MEDICATED_PATCH | TRANSDERMAL | 0 refills | Status: AC

## 2016-01-31 MED ORDER — HYDROMORPHONE HCL 4 MG PO TABS: ORAL_TABLET | ORAL | 0 refills | Status: DC

## 2016-01-31 NOTE — Telephone Encounter (Signed)
Prescription request was received from:    [facility] [address] [place] [address]  Phone: [phone] Fax: [phone] 

## 2016-01-31 NOTE — Telephone Encounter (Signed)
Prescription request was received from:    [facility] [address] [place]  [address]  Phone: [phone] Fax: [phone] 

## 2016-03-18 ENCOUNTER — Other Ambulatory Visit: Payer: Self-pay | Admitting: *Deleted

## 2016-03-18 MED ORDER — HYDROMORPHONE HCL 4 MG PO TABS: ORAL_TABLET | ORAL | 0 refills | Status: DC

## 2016-03-18 NOTE — Telephone Encounter (Signed)
[facility] [phone] Fax: [phone]  

## 2016-03-24 ENCOUNTER — Non-Acute Institutional Stay (SKILLED_NURSING_FACILITY): Payer: Medicare Other | Admitting: Internal Medicine

## 2016-03-24 ENCOUNTER — Encounter: Payer: Self-pay | Admitting: Internal Medicine

## 2016-03-24 DIAGNOSIS — D692 Other nonthrombocytopenic purpura: Secondary | ICD-10-CM | POA: Diagnosis not present

## 2016-03-24 DIAGNOSIS — Z9189 Other specified personal risk factors, not elsewhere classified: Secondary | ICD-10-CM

## 2016-03-24 DIAGNOSIS — G20A1 Parkinson's disease without dyskinesia, without mention of fluctuations: Secondary | ICD-10-CM

## 2016-03-24 DIAGNOSIS — F060 Psychotic disorder with hallucinations due to known physiological condition: Secondary | ICD-10-CM

## 2016-03-24 DIAGNOSIS — G20 Parkinson's disease: Secondary | ICD-10-CM

## 2016-03-24 NOTE — Patient Instructions (Signed)
See assessment and plan under each diagnosis in the problem list and acutely for this visit 

## 2016-03-24 NOTE — Assessment & Plan Note (Signed)
Adjustment in Parkinson's drugs as per Dr. Windle Guard His assessment of the Parkinson associated hallucinations will be requested

## 2016-03-24 NOTE — Progress Notes (Signed)
Facility Location: [facility]  Room Number:127A  Code Status: DNR  This is a nursing facility follow up for specific acute issue of bruising.  Interim medical record and care since last [facility] visit was updated with review of diagnostic studies and change in clinical status since last visit were documented.  HPI: R facial bruising was reported as a new issue. 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 She is not on aspirin, garlic, or any anticoagulants. She has Parkinson's with hallucinations intermittently. In fact she believes the bruising occurred when she was "attacked by a devil or witch". There was no known trauma or  injury. She denies any bleeding dyscrasias.  She had recent evaluation by [facility] on 2/26 and 3/8. The emphasis was on her chronic pain syndrome due to RA & DJD.Neuropathic pain is also chronic &  stable.  She has senile osteoporosis aggravated by the long-term steroids. She nor her health representative wish to initiate any bone modifying therapies. She had been concerned her Fentanyl patch not being "natural" based on television  reports. Additionally she's on hydromorphone. Risk of adverse drug reaction, specifically risk for respiratory suppression is over 80% over the next 6 months. Last labs were 12/20/15. No anemia ;platelet count was normal. Renal function was also normal.  Review of systems: Epistaxis, hemoptysis, hematuria, melena, or rectal bleeding denied. No unexplained weight loss, significant dyspepsia,dysphagia. She describes some abdominal discomfort pre & post meals.  There is no abnormal bruising , bleeding, or difficulty stopping bleeding with injury.  She recently saw her neurologist, Parkinson's drugs were adjusted because of agitation. She has a follow-up visit with the neurologist in the near future.  Physical exam:  Pertinent or positive findings: She has severe rheumatoid  deformities of the hands. She is wheelchair bound. Torticollis is present with deviation of the head to the left. Chest sits on her abdomen. Minor scattered rales present. Abdomen is protuberant. Pedal pulses are surprisingly good.   she exhibits involuntary Parkinson movements of the extremities and trunk. There is ecchymosis around the right lateral mouth and under the chin over the upper neck.   General appearance:Adequately nourished; no acute distress , increased work of breathing is present.   Lymphatic: No lymphadenopathy about the head, neck, axilla . Eyes: No conjunctival inflammation or lid edema is present. There is no scleral icterus. Ears:  External ear exam shows no significant lesions or deformities.   Nose:  External nasal examination shows no deformity or inflammation. Nasal mucosa are pink and moist without lesions ,exudates Oral exam: lips and gums are healthy appearing.There is no oropharyngeal erythema or exudate . Neck:  No thyromegaly, masses, tenderness noted.    Heart:  Normal rate and regular rhythm. S1 and S2 normal without gallop, murmur, click, rub . Abdomen:Bowel sounds are normal. Abdomen is soft and nontender with no organomegaly, hernias,masses. GU: deferred  Extremities:  No cyanosis, clubbing,edema  Neurologic exam : Cn 2-7 intact DTRs equal  in upper & lower extremities but 0-1/2+ in legs Balance,Rhomberg,finger to nose testing could not be completed due to clinical state Skin: Warm & dry w/o tenting. No significant lesions or rash.  #1 bruising most likely sustained during hallucination about being attacked by a witch or devil. Note: The beautician at SNF recommended the patient  placing crucifix room over bed. This will certainly not be discouraged #2 neurology follow-up to adjust her Parkinsons' drugs and anti-hallucination drugs as per Dr  Hayworth Plan: CBC, PT

## 2016-03-24 NOTE — Assessment & Plan Note (Signed)
See 03/24/16 bruising right face and neck most likely sustained during a nightmare/hallucination involving devils & witches CBC, PT

## 2016-03-24 NOTE — Assessment & Plan Note (Signed)
As per dr Windle Guard & Ms Lynnea Ferrier, P-NP

## 2016-03-25 LAB — CBC AND DIFFERENTIAL
HCT: 37 % (ref 36–46)
Hemoglobin: 12.9 g/dL (ref 12.0–16.0)
PLATELETS: 213 10*3/uL (ref 150–399)
WBC: 6.4 10*3/mL

## 2016-03-25 LAB — POCT INR: INR: 1 (ref 0.9–1.1)

## 2016-03-25 LAB — PROTIME-INR: Protime: 12.6 seconds (ref 10.0–13.8)

## 2016-03-26 ENCOUNTER — Encounter: Payer: Self-pay | Admitting: *Deleted

## 2016-06-24 LAB — HEPATIC FUNCTION PANEL
ALK PHOS: 65 (ref 25–125)
ALT: 11 (ref 7–35)
AST: 15 (ref 13–35)
BILIRUBIN, TOTAL: 0.3

## 2016-06-24 LAB — TSH: TSH: 4.5 (ref 0.41–5.90)

## 2016-06-24 LAB — BASIC METABOLIC PANEL
BUN: 14 (ref 4–21)
Creatinine: 0.6 (ref 0.5–1.1)
Glucose: 98
POTASSIUM: 4 (ref 3.4–5.3)
SODIUM: 141 (ref 137–147)

## 2016-07-02 ENCOUNTER — Encounter: Payer: Self-pay | Admitting: Internal Medicine

## 2016-07-02 ENCOUNTER — Non-Acute Institutional Stay (SKILLED_NURSING_FACILITY): Payer: Medicare Other | Admitting: Internal Medicine

## 2016-07-02 DIAGNOSIS — R7989 Other specified abnormal findings of blood chemistry: Secondary | ICD-10-CM

## 2016-07-02 DIAGNOSIS — R8290 Unspecified abnormal findings in urine: Secondary | ICD-10-CM | POA: Diagnosis not present

## 2016-07-02 DIAGNOSIS — R946 Abnormal results of thyroid function studies: Secondary | ICD-10-CM

## 2016-07-02 DIAGNOSIS — F060 Psychotic disorder with hallucinations due to known physiological condition: Secondary | ICD-10-CM

## 2016-07-02 DIAGNOSIS — G20 Parkinson's disease: Secondary | ICD-10-CM

## 2016-07-02 NOTE — Assessment & Plan Note (Signed)
No change in present medical regimen, follow-up with Dr. Windle Guard in 4 months

## 2016-07-02 NOTE — Progress Notes (Signed)
This is a nursing facility follow up of chronic medical diagnoses  Interim medical record and care since last [facility] visit was updated with review of diagnostic studies and change in clinical status since last visit were documented.  HPI: The patient was last assessed 03/24/16 by me for bruising which was felt to be related to injury to self while she was hallucinating about being attacked by an agent of evil . No anemia or thrombocytopenia was present. Her most recent labs of 06/24/16 revealed normal hepatorenal function, chemistries and electrolytes. TSH was minimally elevated at 4.5.[facility] NP and I discussed this minimal elevation and elected not to initiate thyroid supplementation but to recheck the TSH in 6 months. She has Parkinson's and has been followed by Dr. Windle Guard in [facility]. He last evaluated her 06/15/16 to assess response to amantadine which was added on the prior visit because of dystonia. Also Rytary had been prescribed for increased rigidity. The dystonia had improved significantly and mobility had improved in the upper extremities.  She did exhibit asymmetric rigidity in the upper extremities, greater on the left. No change was made in medications based on exam & she was to be rechecked in 4 months. With PT/OT she actually has been able to initiate limited walking. She continues to have hallucinations but is cognizant that they are not real.  Review of systems: She actually states her hallucinations have increased but are not frightening. Her major concern is malodorous urine for last 2 weeks. She has incontinence. She also describes polyuria. Excessive thirst has been an issue. She has chronic abdominal pain which has increased with the urinary symptoms. She describes intermittent sweating without other constitutional symptoms.  Constitutional: No fever,significant weight change, fatigue  Eyes: No redness, discharge, pain, vision change ENT/mouth: No nasal  congestion,  purulent discharge, earache,change in hearing ,sore throat  Cardiovascular: No chest pain, palpitations,paroxysmal nocturnal dyspnea, claudication, edema  Respiratory: No cough, sputum production,hemoptysis, DOE , significant snoring,apnea  Gastrointestinal: No heartburn,dysphagia, nausea / vomiting,rectal bleeding, melena,change in bowels Genitourinary: No dysuria,hematuria, pyuria, nocturia Dermatologic: No rash, pruritus, change in appearance of skin Neurologic: No dizziness,headache,syncope, seizures, numbness , tingling Psychiatric: No significant anxiety , depression, insomnia, anorexia Endocrine: No change in hair/skin/ nails,  excessive hunger  Hematologic/lymphatic: No significant bruising, lymphadenopathy,abnormal bleeding Allergy/immunology: No itchy/ watery eyes, significant sneezing, urticaria, angioedema  Physical exam:  Pertinent or positive findings: She sits in the wheelchair with her head deviated to the left suggesting torticollis. The right pupil is larger than left. The left nasolabial fold is decreased. Her heart rhythm is slow and regular. She has inspiratory pops, rhonchi, and rales at the bases which are low-grade. Bowel sounds are active. She is slightly tender over the mid abdomen to right upper quadrant. There is slight malodorous character to her presence. Pedal pulses are decreased. She has slight edema the lower extremities. She has flexion contractures of hands greater on the left than the right. There is an intermittent slight tremor of the right thumb. She has scattered keratotic lesions over the hands, greater on the right. General appearance:thin but adequately nourished; no acute distress , increased work of breathing is present.   Lymphatic: No lymphadenopathy about the head, neck, axilla . Eyes: No conjunctival inflammation or lid edema is present. There is no scleral icterus. Ears:  External ear exam shows no significant lesions or deformities.     Nose:  External nasal examination shows no deformity or inflammation. Nasal mucosa are pink and  moist without lesions ,exudates Oral exam: lips and gums are healthy appearing.There is no oropharyngeal erythema or exudate . Neck:  No thyromegaly, masses, tenderness noted.    Heart:   S1 and S2 normal without gallop, murmur, click, rub .  Abdomen:Bowel sounds are normal. Abdomen is soft with no organomegaly, hernias,masses. GU: deferred  Extremities:  No cyanosis, clubbing Skin: Warm & dry w/o tenting. No significant rash.  See summary under each active problem in the Problem List with associated updated therapeutic plan

## 2016-07-02 NOTE — Patient Instructions (Signed)
See assessment and plan under each diagnosis in the problem list and acutely for this visit 

## 2016-07-02 NOTE — Assessment & Plan Note (Signed)
No change in medications as the patient is not disturbed by the hallucinations at this time

## 2016-08-11 ENCOUNTER — Non-Acute Institutional Stay (SKILLED_NURSING_FACILITY): Payer: Medicare Other

## 2016-08-11 DIAGNOSIS — Z0000 Encounter for general adult medical examination without abnormal findings: Secondary | ICD-10-CM

## 2016-08-11 NOTE — Patient Instructions (Signed)
Sheri Molina , Thank you for taking time to come for your Medicare Wellness Visit. I appreciate your ongoing commitment to your health goals. Please review the following plan we discussed and let me know if I can assist you in the future.   Screening recommendations/referrals: Colonoscopy excluded, pt over age 81 Mammogram excluded, pt over age 28 Bone Density due, excluded pt non ambulatory Recommended yearly ophthalmology/optometry visit for glaucoma screening and checkup Recommended yearly dental visit for hygiene and checkup  Vaccinations: Influenza vaccine due 2018 fall season Pneumococcal vaccine 13 due Tdap vaccine due Shingles vaccine not in records    Advanced directives: DNR in chart, copies of health care power of attorney and living will are needed   Conditions/risks identified: None  Next appointment: Dr. Alwyn Ren makes rounds   Preventive Care 65 Years and Older, Female Preventive care refers to lifestyle choices and visits with your health care provider that can promote health and wellness. What does preventive care include?  A yearly physical exam. This is also called an annual well check.  Dental exams once or twice a year.  Routine eye exams. Ask your health care provider how often you should have your eyes checked.  Personal lifestyle choices, including:  Daily care of your teeth and gums.  Regular physical activity.  Eating a healthy diet.  Avoiding tobacco and drug use.  Limiting alcohol use.  Practicing safe sex.  Taking low-dose aspirin every day.  Taking vitamin and mineral supplements as recommended by your health care provider. What happens during an annual well check? The services and screenings done by your health care provider during your annual well check will depend on your age, overall health, lifestyle risk factors, and family history of disease. Counseling  Your health care provider may ask you questions about your:  Alcohol  use.  Tobacco use.  Drug use.  Emotional well-being.  Home and relationship well-being.  Sexual activity.  Eating habits.  History of falls.  Memory and ability to understand (cognition).  Work and work Astronomer.  Reproductive health. Screening  You may have the following tests or measurements:  Height, weight, and BMI.  Blood pressure.  Lipid and cholesterol levels. These may be checked every 5 years, or more frequently if you are over 65 years old.  Skin check.  Lung cancer screening. You may have this screening every year starting at age 61 if you have a 30-pack-year history of smoking and currently smoke or have quit within the past 15 years.  Fecal occult blood test (FOBT) of the stool. You may have this test every year starting at age 71.  Flexible sigmoidoscopy or colonoscopy. You may have a sigmoidoscopy every 5 years or a colonoscopy every 10 years starting at age 45.  Hepatitis C blood test.  Hepatitis B blood test.  Sexually transmitted disease (STD) testing.  Diabetes screening. This is done by checking your blood sugar (glucose) after you have not eaten for a while (fasting). You may have this done every 1-3 years.  Bone density scan. This is done to screen for osteoporosis. You may have this done starting at age 19.  Mammogram. This may be done every 1-2 years. Talk to your health care provider about how often you should have regular mammograms. Talk with your health care provider about your test results, treatment options, and if necessary, the need for more tests. Vaccines  Your health care provider may recommend certain vaccines, such as:  Influenza vaccine. This is recommended every  year.  Tetanus, diphtheria, and acellular pertussis (Tdap, Td) vaccine. You may need a Td booster every 10 years.  Zoster vaccine. You may need this after age 61.  Pneumococcal 13-valent conjugate (PCV13) vaccine. One dose is recommended after age  11.  Pneumococcal polysaccharide (PPSV23) vaccine. One dose is recommended after age 51. Talk to your health care provider about which screenings and vaccines you need and how often you need them. This information is not intended to replace advice given to you by your health care provider. Make sure you discuss any questions you have with your health care provider. Document Released: 01/18/2015 Document Revised: 09/11/2015 Document Reviewed: 10/23/2014 Elsevier Interactive Patient Education  2017 [place] Prevention in the Home Falls can cause injuries. They can happen to people of all ages. There are many things you can do to make your home safe and to help prevent falls. What can I do on the outside of my home?  Regularly fix the edges of walkways and driveways and fix any cracks.  Remove anything that might make you trip as you walk through a door, such as a raised step or threshold.  Trim any bushes or trees on the path to your home.  Use bright outdoor lighting.  Clear any walking paths of anything that might make someone trip, such as rocks or tools.  Regularly check to see if handrails are loose or broken. Make sure that both sides of any steps have handrails.  Any raised decks and porches should have guardrails on the edges.  Have any leaves, snow, or ice cleared regularly.  Use sand or salt on walking paths during winter.  Clean up any spills in your garage right away. This includes oil or grease spills. What can I do in the bathroom?  Use night lights.  Install grab bars by the toilet and in the tub and shower. Do not use towel bars as grab bars.  Use non-skid mats or decals in the tub or shower.  If you need to sit down in the shower, use a plastic, non-slip stool.  Keep the floor dry. Clean up any water that spills on the floor as soon as it happens.  Remove soap buildup in the tub or shower regularly.  Attach bath mats securely with double-sided  non-slip rug tape.  Do not have throw rugs and other things on the floor that can make you trip. What can I do in the bedroom?  Use night lights.  Make sure that you have a light by your bed that is easy to reach.  Do not use any sheets or blankets that are too big for your bed. They should not hang down onto the floor.  Have a firm chair that has side arms. You can use this for support while you get dressed.  Do not have throw rugs and other things on the floor that can make you trip. What can I do in the kitchen?  Clean up any spills right away.  Avoid walking on wet floors.  Keep items that you use a lot in easy-to-reach places.  If you need to reach something above you, use a strong step stool that has a grab bar.  Keep electrical cords out of the way.  Do not use floor polish or wax that makes floors slippery. If you must use wax, use non-skid floor wax.  Do not have throw rugs and other things on the floor that can make you trip. What can  I do with my stairs?  Do not leave any items on the stairs.  Make sure that there are handrails on both sides of the stairs and use them. Fix handrails that are broken or loose. Make sure that handrails are as long as the stairways.  Check any carpeting to make sure that it is firmly attached to the stairs. Fix any carpet that is loose or worn.  Avoid having throw rugs at the top or bottom of the stairs. If you do have throw rugs, attach them to the floor with carpet tape.  Make sure that you have a light switch at the top of the stairs and the bottom of the stairs. If you do not have them, ask someone to add them for you. What else can I do to help prevent falls?  Wear shoes that:  Do not have high heels.  Have rubber bottoms.  Are comfortable and fit you well.  Are closed at the toe. Do not wear sandals.  If you use a stepladder:  Make sure that it is fully opened. Do not climb a closed stepladder.  Make sure that both  sides of the stepladder are locked into place.  Ask someone to hold it for you, if possible.  Clearly mark and make sure that you can see:  Any grab bars or handrails.  First and last steps.  Where the edge of each step is.  Use tools that help you move around (mobility aids) if they are needed. These include:  Canes.  Walkers.  Scooters.  Crutches.  Turn on the lights when you go into a dark area. Replace any light bulbs as soon as they burn out.  Set up your furniture so you have a clear path. Avoid moving your furniture around.  If any of your floors are uneven, fix them.  If there are any pets around you, be aware of where they are.  Review your medicines with your doctor. Some medicines can make you feel dizzy. This can increase your chance of falling. Ask your doctor what other things that you can do to help prevent falls. This information is not intended to replace advice given to you by your health care provider. Make sure you discuss any questions you have with your health care provider. Document Released: 10/18/2008 Document Revised: 05/30/2015 Document Reviewed: 01/26/2014 Elsevier Interactive Patient Education  2017 [employer].

## 2016-08-11 NOTE — Progress Notes (Signed)
Subjective:   Sheri Molina is a 83 y.o. female who presents for an Initial Medicare Annual Wellness Visit at [facility]       Objective:    Today's Vitals   08/11/16 1551  BP: 125/60  Pulse: 81  Temp: (!) 97.4 F (36.3 C)  TempSrc: Oral  SpO2: 95%  Weight: 119 lb (54 kg)  Height: 5\' 5"  (1.651 m)   Body mass index is 19.8 kg/m.   Current Medications (verified) Outpatient Encounter Prescriptions as of 08/11/2016  Medication Sig  . amantadine (SYMMETREL) 100 MG capsule Take 100 mg by mouth 2 (two) times daily.  . AMBULATORY NON FORMULARY MEDICATION Magic Cup by mouth twice daily  . AMBULATORY NON FORMULARY MEDICATION Occusoft Baby eyelid towelette 1 wipe every night  . Apomorphine HCl (APOKYN) 30 MG/3ML SOCT Inject into the skin. Inject 0.2 ml (2mg ) SQ up to five times daily as needed for increased parkinson's symptoms  . bisacodyl (DULCOLAX) 10 MG suppository Place 10 mg rectally as needed for moderate constipation.  . Carbidopa-Levodopa ER 36.25-145 MG CPCR Take 2 capsules by mouth 3 (three) times daily.  . cetirizine (ZYRTEC) 10 MG tablet Take 10 mg by mouth daily.  . Cholecalciferol (VITAMIN D3) 50000 units TABS Take by mouth. Take 1 tablet once a month  . docusate sodium (COLACE) 100 MG capsule Take 100 mg by mouth daily.  . DULoxetine (CYMBALTA) 60 MG capsule Take 60 mg by mouth daily.  . fentaNYL (DURAGESIC - DOSED MCG/HR) 25 MCG/HR patch Apply one patch topically every 72 hours. Remove old patch before placement. Rotate sites  . hydrocortisone (ANUSOL-HC) 2.5 % rectal cream Place 1 application rectally daily as needed for hemorrhoids or itching.  Marland Kitchen HYDROmorphone (DILAUDID) 4 MG tablet Take one tablet by mouth every 8 hours as needed for pain.  . Liniments (SALONPAS PAIN RELIEF PATCH EX) Apply 1 patch topically every 8 (eight) hours as needed.  . Magnesium Hydroxide (MILK OF MAGNESIA PO) Take 30 mLs by mouth as needed.  . metoprolol succinate (TOPROL-XL) 25  MG 24 hr tablet Take 25 mg by mouth daily.  . Omega-3 Fatty Acids (FISH OIL) 1000 MG CAPS Take 1 capsule by mouth daily.   Marland Kitchen omeprazole (PRILOSEC) 40 MG capsule Take 40 mg by mouth daily.  Sheri Molina Shell 500 MG TABS Take 500 mg by mouth 2 (two) times daily.  Marland Kitchen Pimavanserin Tartrate (NUPLAZID PO) Take 34 mg by mouth daily.  . polyethylene glycol powder (GLYCOLAX/MIRALAX) powder Take 17 g by mouth 2 (two) times daily.  . pramipexole (MIRAPEX) 1 MG tablet Take 2 mg by mouth 3 (three) times daily.   . prednisoLONE 5 MG TABS tablet Take 5 mg by mouth daily.  . [DISCONTINUED] fluticasone (FLONASE) 50 MCG/ACT nasal spray Place 2 sprays into both nostrils daily.   No facility-administered encounter medications on file as of 08/11/2016.     Allergies (verified) Ampicillin   History: Past Medical History:  Diagnosis Date  . Allergic rhinitis, cause unspecified   . Chronic kidney disease, unspecified   . Degeneration of lumbar or lumbosacral intervertebral disc   . Diaphragmatic hernia without mention of obstruction or gangrene   . Generalized osteoarthrosis, involving multiple sites   . GERD (gastroesophageal reflux disease)   . Hyperlipidemia   . Hypertension   . Major depressive disorder, recurrent episode, moderate (HCC)   . Myalgia and myositis, unspecified   . Parkinson disease (HCC)   . Peripheral vascular disease, unspecified (HCC)   .  Rheumatoid arthritis(714.0)   . Senile osteoporosis   . Unspecified constipation   . 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 Urinary tract infection, site not specified    Past Surgical History:  Procedure Laterality Date  . APPENDECTOMY     Family History  Problem Relation Age of Onset  . Stroke Mother        died @ 19  . Cancer Sister        Unknown primary. Sister also had mental illness  . Heart disease Neg Hx   . Diabetes Neg Hx     Social History   Occupational History  . Not on file.   Social History Main Topics  . Smoking status: Never Smoker  . Smokeless tobacco: Never Used  . Alcohol use No  . Drug use: No  . Sexual activity: Not on file    Tobacco Counseling Counseling given: Not Answered   Activities of Daily Living In your present state of health, do you have any difficulty performing the following activities: 08/11/2016  Hearing? N  Vision? N  Difficulty concentrating or making decisions? Y  Walking or climbing stairs? Y  Dressing or bathing? Y  Doing errands, shopping? Y  Preparing Food and eating ? Y  Using the Toilet? Y  In the past six months, have you accidently leaked urine? Y  Do you have problems with loss of bowel control? Y  Managing your Medications? Y  Managing your Finances? Y  Housekeeping or managing your Housekeeping? Y  Some recent data might be hidden    Immunizations and Health Maintenance Immunization History  Administered Date(s) Administered  . Influenza-Unspecified 10/03/2015   Health Maintenance Due  Topic Date Due  . PNA vac Low Risk Adult (1 of 2 - PCV13) 12/07/1996  . INFLUENZA VACCINE  08/05/2016    Patient Care Team: Pecola Lawless, MD as PCP - General (Internal Medicine)  Indicate any recent Medical Services you may have received from other than [facility] providers in the past year (date may be approximate).     Assessment:   This is a routine wellness examination for Sheri Molina.   Hearing/Vision screen No exam data present  Dietary issues and exercise activities discussed: Current Exercise Habits: The patient does not participate in regular exercise at present, Exercise limited by: orthopedic condition(s)  Goals    None     Depression Screen PHQ 2/9 Scores 08/11/2016  PHQ - 2 Score 0    Fall Risk Fall Risk  08/11/2016  Falls in the past year? No    Cognitive Function:     6CIT Screen 08/11/2016  What Year? 4 points  What month? 0 points    What time? 0 points  Count back from 20 0 points  Months in reverse 0 points  Repeat phrase 8 points  Total Score 12    Screening Tests Health Maintenance  Topic Date Due  . PNA vac Low Risk Adult (1 of 2 - PCV13) 12/07/1996  . INFLUENZA VACCINE  08/05/2016  . TETANUS/TDAP  05/05/2018 (Originally 12/08/1948)  . DEXA SCAN  01/06/2023 (Originally 12/07/1996)      Plan:    I have personally reviewed and addressed the Medicare Annual Wellness questionnaire and have noted the following in the patient's chart:  A. Medical and social history B. Use of alcohol, tobacco or illicit drugs  C. Current medications and supplements D. Functional  ability and status E.  Nutritional status F.  Physical activity G. Advance directives H. List of other physicians I.  Hospitalizations, surgeries, and ER visits in previous 12 months J.  Vitals K. Screenings to include hearing, vision, cognitive, depression L. Referrals and appointments - none  In addition, I have reviewed and discussed with patient certain preventive protocols, quality metrics, and best practice recommendations. A written personalized care plan for preventive services as well as general preventive health recommendations were provided to patient.  See attached scanned questionnaire for additional information.   Signed,   Annetta Maw, RN Nurse Health Advisor   Quick Notes   Health Maintenance: PNA 13, TDAP and DEXA due. DEXA not ordered, pt non ambulatory     Abnormal Screen: 6 CIT-12     Patient Concerns: None     Nurse Concerns: None I have personally reviewed the health advisor's clinical note, was available for consultation, and agree with the assessment and plan as written. Pecola Lawless M.D., FACP, [facility]

## 2016-10-15 LAB — LIPID PANEL
CHOLESTEROL: 243 — AB (ref 0–200)
HDL: 66 (ref 35–70)
LDL Cholesterol: 159
LDL/HDL RATIO: 3.7
Triglycerides: 92 (ref 40–160)

## 2016-10-16 ENCOUNTER — Encounter: Payer: Self-pay | Admitting: Internal Medicine

## 2016-10-16 ENCOUNTER — Non-Acute Institutional Stay (SKILLED_NURSING_FACILITY): Payer: Medicare Other | Admitting: Internal Medicine

## 2016-10-16 DIAGNOSIS — G20 Parkinson's disease: Secondary | ICD-10-CM

## 2016-10-16 DIAGNOSIS — R05 Cough: Secondary | ICD-10-CM

## 2016-10-16 DIAGNOSIS — K219 Gastro-esophageal reflux disease without esophagitis: Secondary | ICD-10-CM | POA: Diagnosis not present

## 2016-10-16 DIAGNOSIS — I10 Essential (primary) hypertension: Secondary | ICD-10-CM

## 2016-10-16 DIAGNOSIS — F060 Psychotic disorder with hallucinations due to known physiological condition: Secondary | ICD-10-CM | POA: Diagnosis not present

## 2016-10-16 DIAGNOSIS — R059 Cough, unspecified: Secondary | ICD-10-CM

## 2016-10-16 NOTE — Patient Instructions (Addendum)
See assessment and plan under each diagnosis in the problem list and acutely for this visit Total time 26 minutes; greater than 50% of the visit spent counseling patient and coordinating care for problems addressed at this encounter

## 2016-10-16 NOTE — Assessment & Plan Note (Signed)
Psychiatric follow-up will be continued on regularly scheduled basis

## 2016-10-16 NOTE — Assessment & Plan Note (Signed)
BP controlled; no change in antihypertensive medications  

## 2016-10-16 NOTE — Assessment & Plan Note (Signed)
Patient continues to have pre-and post meal symptoms, continue PPI

## 2016-10-16 NOTE — Progress Notes (Signed)
NURSING HOME LOCATION:  [facility] ROOM NUMBER:  [id number]  CODE STATUS:  DNR  PCP:  Pecola Lawless, MD  [address] [place] [address]  This is a nursing facility follow up of chronic medical diagnoses  Interim medical record and care since last [facility] visit was updated with review of diagnostic studies and change in clinical status since last visit were documented.  HPI: The patient is a permanent resident of the facility with medical comorbidities of Parkinson's with psychotic hallucinations, hypertension, GERD, dysphagia, peripheral neuropathy, rheumatoid arthritis, dyslipidemia, chronic pain syndrome, and major depression. Dr Windle Guard, neurologist follows her Parkinson's and has been titrating medications for it and associated psychotic hallucinations. Chemistries performed 06/24/16 were normal. TSH was upper limits of normal at 4.50.  Review of systems:  Date given as 10/05/2017. She did identify  the president. She states she saw her neurologist yesterday and he increased her Parkinson's medication. She states she's not sure whether she will increase the dose as he recommended. She exhibited a disjointed, nonsensical speech pattern. When asked if she had any active problems she stated "thinngs live in , not pigs, people who have the devil in mind" She states that she has had an intermittent cough with some yellow sputum. She has history of asthma. She's never smoked.  Occasional frontal headache described She describes occasional abdominal pain either before or after meals. She has occasional constipation. Anxiety is variable.  Constitutional: No fever,significant weight change, fatigue  Eyes: No redness, discharge, pain, vision change ENT/mouth: No nasal congestion,  purulent discharge, earache,change in hearing ,sore throat  Cardiovascular: No chest pain, palpitations,paroxysmal nocturnal dyspnea, claudication, edema  Respiratory: No hemoptysis, DOE (w/c  bound) , significant snoring,apnea  Gastrointestinal: No heartburn,dysphagia, nausea / vomiting,rectal bleeding, melena Genitourinary: No dysuria,hematuria, pyuria,  incontinence, nocturia Musculoskeletal: No joint stiffness, joint swelling, weakness,pain Dermatologic: No rash, pruritus, change in appearance of skin Neurologic: No dizziness,syncope, seizures, numbness , tingling Psychiatric: No significant depression, insomnia, anorexia Endocrine: No change in hair/skin/ nails, excessive thirst, excessive hunger, excessive urination  Hematologic/lymphatic: No significant bruising, lymphadenopathy,abnormal bleeding Allergy/immunology: No itchy/ watery eyes, significant sneezing, urticaria, angioedema  Physical exam:  Pertinent or positive findings:Anisocoria is present with the right pupil greater than the left. There is decreased excursion of the mandible. S4 gallop is suggested. She has dry, rub-like rales at the bases. The thoracic has increased curvature. There is weakness to opposition in the left upper and left lower extremity more so than the right. She has severe rheumatoid arthritic deformities of the hands. Lower extremities are atrophic. Pedal pulses are decreased.   General appearance:Adequately nourished; no acute distress , increased work of breathing is present.   Lymphatic: No lymphadenopathy about the head, neck, axilla . Eyes: No conjunctival inflammation or lid edema is present. There is no scleral icterus. Ears:  External ear exam shows no significant lesions or deformities.   Nose:  External nasal examination shows no deformity or inflammation. Nasal mucosa are pink and moist without lesions ,exudates Oral exam: lips and gums are healthy appearing.There is no oropharyngeal erythema or exudate . Neck:  No thyromegaly, masses, tenderness noted.    Heart:  No murmur, click, rub . Chest  without wheezes, rhonchi. Abdomen:Bowel sounds are normal. Abdomen is soft and nontender  with no organomegaly, hernias,masses. GU: deferred  Extremities:  No cyanosis, clubbing,edema  Neurologic exam : Balance,Rhomberg,finger to nose testing could not be completed due to clinical state Skin: Warm & dry w/o  tenting. No significant lesions or rash.  See summary under each active problem in the Problem List with associated updated therapeutic plan

## 2016-10-16 NOTE — Assessment & Plan Note (Signed)
10/16/16 encouraged her to follow Dr. Karle Plumber recommendations in reference to her Parkinson's and psychosis medications

## 2016-10-28 LAB — BASIC METABOLIC PANEL
BUN: 14 (ref 4–21)
CREATININE: 0.6 (ref 0.5–1.1)
Glucose: 99
POTASSIUM: 4.1 (ref 3.4–5.3)
Sodium: 141 (ref 137–147)

## 2016-10-28 LAB — CBC AND DIFFERENTIAL
HCT: 41 (ref 36–46)
Hemoglobin: 13.9 (ref 12.0–16.0)
Neutrophils Absolute: 4
Platelets: 241 (ref 150–399)
WBC: 6.1

## 2016-12-31 LAB — LIPID PANEL
CHOLESTEROL: 192 (ref 0–200)
HDL: 58 (ref 35–70)
LDL CALC: 116
LDL/HDL RATIO: 3.3
TRIGLYCERIDES: 92 (ref 40–160)

## 2016-12-31 LAB — HEPATIC FUNCTION PANEL
ALK PHOS: 63 (ref 25–125)
ALT: 5 — AB (ref 7–35)
AST: 15 (ref 13–35)
Bilirubin, Total: 0.4

## 2016-12-31 LAB — VITAMIN D 25 HYDROXY (VIT D DEFICIENCY, FRACTURES): Vit D, 25-Hydroxy: 28.36

## 2016-12-31 LAB — VITAMIN B12: VITAMIN B 12: 496

## 2016-12-31 LAB — HEMOGLOBIN A1C: Hemoglobin A1C: 5.9

## 2016-12-31 LAB — CBC AND DIFFERENTIAL
HEMATOCRIT: 41 (ref 36–46)
HEMOGLOBIN: 13.7 (ref 12.0–16.0)
NEUTROS ABS: 3
PLATELETS: 210 (ref 150–399)
WBC: 6.1

## 2016-12-31 LAB — BASIC METABOLIC PANEL
BUN: 14 (ref 4–21)
Creatinine: 0.6 (ref 0.5–1.1)
Glucose: 88
POTASSIUM: 4.1 (ref 3.4–5.3)
SODIUM: 144 (ref 137–147)

## 2016-12-31 LAB — TSH: TSH: 2.69 (ref 0.41–5.90)

## 2017-01-28 ENCOUNTER — Non-Acute Institutional Stay (SKILLED_NURSING_FACILITY): Payer: Medicare Other | Admitting: Internal Medicine

## 2017-01-28 ENCOUNTER — Encounter: Payer: Self-pay | Admitting: Internal Medicine

## 2017-01-28 DIAGNOSIS — G20 Parkinson's disease: Secondary | ICD-10-CM

## 2017-01-28 DIAGNOSIS — R159 Full incontinence of feces: Secondary | ICD-10-CM | POA: Diagnosis not present

## 2017-01-28 DIAGNOSIS — E785 Hyperlipidemia, unspecified: Secondary | ICD-10-CM

## 2017-01-28 NOTE — Patient Instructions (Signed)
See assessment and plan under each diagnosis in the problem list and acutely for this visit 

## 2017-01-28 NOTE — Assessment & Plan Note (Signed)
Verify transportation for neurology follow-up visit

## 2017-01-28 NOTE — Assessment & Plan Note (Addendum)
Discontinue Colace and MiraLAX on a regular basis. Change to as needed for hard stool/ constipation Assess actual Dilaudid administration as constipation would be expected with regular opioid use

## 2017-01-28 NOTE — Progress Notes (Signed)
NURSING HOME LOCATION:  [facility] ROOM NUMBER:  [id number]  CODE STATUS:  DNR  PCP:  Pecola Lawless, MD  [address] [place] [address]  This is a nursing facility follow up for specific chronic medical diagnoses   Interim medical record and care since last [facility] visit was updated with review of diagnostic studies and change in clinical status since last visit were documented.  HPI: The patient is a permanent resident of the facility with medical diagnoses of essential hypertension, peripheral vascular disease, GERD, dysphagia, Parkinson's, rheumatoid arthritis, and psychosis with hallucinations. Neurologist, Jason Coop MD sees her on a routine basis and adjusts the medications. His next assessment will be in April 2019. Parkinsonian therapy includes apomorphine.  She has been seen by psychiatry & she was prescribed Nupliazid for the Parkinson's associated psychoses with hallucinations. This has been continued. The patient has chronic pain related to rheumatoid arthritis and is on polypharmacy including Dilaudid & fentanyl. Labs are current; lipids are excellent. These have improved significantly since 10/15/16. The patient is on low-dose steroids daily for the rheumatoid arthritis. A1c is prediabetic at 5.9%.  Review of systems: The patient is oriented to self and place. She gave the date as March 24, 20??. She has chronic, persistent rheumatoid arthritis pain. This is essentially quiescent today however. She describes a "flare" almost every 10 days. She describes urinary and stool incontinence and apparently used to see a urologist. She is on two stool softener as well as MiraLAX. The MiraLAX and stool softeners are maintenance basis  Constitutional: No fever,significant weight change, fatigue  Eyes: No redness, discharge, pain, vision change ENT/mouth: No nasal congestion,  purulent discharge, earache,change in hearing ,sore throat  Cardiovascular: No chest  pain, palpitations,paroxysmal nocturnal dyspnea, claudication, edema  Respiratory: No cough, sputum production,hemoptysis, DOE , significant snoring,apnea  Gastrointestinal: No heartburn,dysphagia,abdominal pain, nausea / vomiting,rectal bleeding, melena Genitourinary: No dysuria,hematuria, pyuria, nocturia Dermatologic: No rash, pruritus, change in appearance of skin Neurologic: No dizziness,headache,syncope, seizures, numbness , tingling Psychiatric: No significant anxiety , depression, insomnia, anorexia Endocrine: No change in hair/skin/ nails, excessive thirst, excessive hunger, excessive urination  Hematologic/lymphatic: No significant bruising, lymphadenopathy,abnormal bleeding Allergy/immunology: No itchy/ watery eyes, significant sneezing, urticaria, angioedema  Physical exam:  Pertinent or positive findings: On exam she is lying in bed ;she exhibits masklike facies. Ptosis is present greater on the left. The left nasolabial fold is decreased. She has torticollis with deviation of the head and neck to the left. She has a slight gallop cadence with a grade 1/2 systolic murmur. She has severe rheumatoid arthritis deformities in the hands with lateral deviation and contractures. She also has contractures of her toes. There is weakness to opposition in the left upper extremities compared to the right.She has scattered keratotic lesions over the forearms. When we entered the room she was being cleaned. She had a large amount of soft- liquid stool around the buttocks and rectum.  General appearance: no acute distress , increased work of breathing is present.   Lymphatic: No lymphadenopathy about the head, neck, axilla . Eyes: No conjunctival inflammation or lid edema is present. There is no scleral icterus. Ears:  External ear exam shows no significant lesions or deformities.   Nose:  External nasal examination shows no deformity or inflammation. Nasal mucosa are pink and moist without lesions  ,exudates Oral exam: lips and gums are healthy appearing.There is no oropharyngeal erythema or exudate . Neck:  No thyromegaly, masses, tenderness noted.  Heart:  No click, rub .  Lungs:without wheezes, rhonchi,rales , rubs. Abdomen:Bowel sounds are normal. Abdomen is soft and nontender with no organomegaly, hernias,masses. GU: deferred  Extremities:  No cyanosis, clubbing,edema  Neurologic exam : Balance,Rhomberg,finger to nose testing could not be completed due to clinical state Skin: Warm & dry w/o tenting. No significant lesions or rash.  See summary under each active problem in the Problem List with associated updated therapeutic plan

## 2017-01-28 NOTE — Assessment & Plan Note (Signed)
LDL was 116 and HDL 58 without statin therapy. Such is not indicated @ her age.

## 2017-03-12 LAB — HM DIABETES FOOT EXAM

## 2017-03-13 LAB — HEMOGLOBIN A1C: HEMOGLOBIN A1C: 5.7

## 2017-04-08 ENCOUNTER — Non-Acute Institutional Stay (SKILLED_NURSING_FACILITY): Payer: Medicare Other | Admitting: Internal Medicine

## 2017-04-08 ENCOUNTER — Encounter: Payer: Self-pay | Admitting: Internal Medicine

## 2017-04-08 DIAGNOSIS — G20 Parkinson's disease: Secondary | ICD-10-CM

## 2017-04-08 DIAGNOSIS — I10 Essential (primary) hypertension: Secondary | ICD-10-CM | POA: Diagnosis not present

## 2017-04-08 DIAGNOSIS — F060 Psychotic disorder with hallucinations due to known physiological condition: Secondary | ICD-10-CM | POA: Diagnosis not present

## 2017-04-08 DIAGNOSIS — G20A1 Parkinson's disease without dyskinesia, without mention of fluctuations: Secondary | ICD-10-CM

## 2017-04-08 NOTE — Assessment & Plan Note (Addendum)
04/08/17 blood pressure actually low Low-dose beta blocker will be held and blood pressures monitored the relative hypotension could be contributing to her mental state

## 2017-04-08 NOTE — Patient Instructions (Signed)
See assessment and plan under each diagnosis in the problem list and acutely for this visit 

## 2017-04-08 NOTE — Assessment & Plan Note (Signed)
? ?  Neurology follow-up ?

## 2017-04-08 NOTE — Assessment & Plan Note (Addendum)
04/08/17 she confabulates about stopping her Parkinson's medications. Neurology follow-up with Dr. Windle Guard to assess present therapy; Psych NP will continue monitoring her

## 2017-04-08 NOTE — Progress Notes (Signed)
    NURSING HOME LOCATION:  [facility] ROOM NUMBER:  [id number]  CODE STATUS:  DNR  PCP:  Pecola Lawless, MD  [address] [place] [address]  This is a nursing facility follow up of chronic medical diagnoses.  Interim medical record and care since last [facility] visit was updated with review of diagnostic studies and change in clinical status since last visit were documented.  HPI: The patient is a permanent resident of facility with medical diagnoses of Parkinson's with psychotic hallucinations, essential hypertension, GERD, with dysphagia, advanced rheumatoid arthritis, dyslipidemia, chronic pain syndrome,& major depression. Her Parkinson's disease is being monitored by Dr. Windle Guard, Neurology. He saw her 11/23/16 ;no changes in the Sinemet made. That note suggest a follow-up appointment this month.  Psychiatry NP had prescribed Nupliazid for the hallucinations. Most recent labs were 12/31/16. There were no significant abnormalities on chemistries, lipids, CBC, and TSH.  Review of systems: Her roommate reports that the patient has not been sleeping well lately. Dementia invalidated responses. Date given as 10/21/2017. She states she has stopped all her Parkinson's medications and takes pain medications" 0.5 or 0.20". She states that "another lady is taking her( Parkinson's) pills". She stated her neurologist would be okay with this. She thinks she has an appointment to see him this month. She states that she has a growth in the left inguinal area. She gave a completely different history to the NP student. To her she described 3 day history of nausea without vomiting, some anorexia, and nonproductive cough. None of these were mentioned to me. She did not divulge symptoms aout the left inguinal "growth" to the Nurse.  Of note is seeing her later in the middle of the hall apparently carrying on a conversation with an imaginary individual.  Physical exam:  Pertinent or positive  findings: Anxious ; frail, sitting in wheelchair. Minimal anisocoria is suggested. She has dramatic torticollis with deviation of the head and neck to the left and anteriorly. Heart sounds are distant and minimally irregular. She has dry rales at the bases. She has severe contractures of the hands. She is symmetrically severely weak in all extremities. I could appreciate no left lower quadrant or inguinal area mass.  General appearance: no acute distress, increased work of breathing is present despite agitation   Lymphatic: No lymphadenopathy about the head, neck, axilla. Eyes: No conjunctival inflammation or lid edema is present. There is no scleral icterus. Ears:  External ear exam shows no significant lesions or deformities.   Nose:  External nasal examination shows no deformity or inflammation. Nasal mucosa are pink and moist without lesions, exudates Oral exam:  Lips and gums are healthy appearing. There is no oropharyngeal erythema or exudate. Neck:  No thyromegaly, masses, tenderness noted.    Heart:  No gallop, murmur, click, rub .  Lungs:without wheezes, rhonchi,rubs. Abdomen:Bowel sounds are normal. Abdomen is soft and nontender with no organomegaly, hernias,masses. GU: deferred  Extremities:  No cyanosis, clubbing,edema  Neurologic exam : Balance,Rhomberg,finger to nose testing could not be completed due to clinical state Skin: Warm & dry w/o tenting. No significant lesions or rash.  See summary under each active problem in the Problem List with associated updated therapeutic plan

## 2017-04-10 LAB — BASIC METABOLIC PANEL
BUN: 17 (ref 4–21)
Creatinine: 0.6 (ref 0.5–1.1)
Glucose: 93
Potassium: 4.5 (ref 3.4–5.3)
SODIUM: 143 (ref 137–147)

## 2017-04-12 LAB — CBC AND DIFFERENTIAL
HCT: 43 (ref 36–46)
Hemoglobin: 14.3 (ref 12.0–16.0)
Neutrophils Absolute: 4
Platelets: 248 (ref 150–399)
WBC: 8

## 2017-04-12 LAB — BASIC METABOLIC PANEL
BUN: 17 (ref 4–21)
CREATININE: 0.7 (ref 0.5–1.1)
GLUCOSE: 92
POTASSIUM: 4.1 (ref 3.4–5.3)
Sodium: 141 (ref 137–147)

## 2017-07-29 ENCOUNTER — Non-Acute Institutional Stay (SKILLED_NURSING_FACILITY): Payer: Medicare Other | Admitting: Internal Medicine

## 2017-07-29 ENCOUNTER — Encounter: Payer: Self-pay | Admitting: Internal Medicine

## 2017-07-29 DIAGNOSIS — F060 Psychotic disorder with hallucinations due to known physiological condition: Secondary | ICD-10-CM | POA: Diagnosis not present

## 2017-07-29 DIAGNOSIS — G20 Parkinson's disease: Secondary | ICD-10-CM

## 2017-07-29 LAB — CBC AND DIFFERENTIAL
HEMATOCRIT: 42 (ref 36–46)
HEMOGLOBIN: 13.7 (ref 12.0–16.0)
NEUTROS ABS: 3
Platelets: 270 (ref 150–399)
WBC: 65.4
WBC: 5.4

## 2017-07-29 LAB — HEPATIC FUNCTION PANEL
ALT: 7 (ref 7–35)
AST: 13 (ref 13–35)
Alkaline Phosphatase: 80 (ref 25–125)
Bilirubin, Total: 0.4

## 2017-07-29 LAB — BASIC METABOLIC PANEL
BUN: 11 (ref 4–21)
Creatinine: 0.6 (ref 0.5–1.1)
GLUCOSE: 98
Potassium: 3.6 (ref 3.4–5.3)
Sodium: 142 (ref 137–147)

## 2017-07-29 NOTE — Assessment & Plan Note (Addendum)
As per Dr. Windle Guard, Neurology Copy of today's note to him for continuity of care

## 2017-07-29 NOTE — Progress Notes (Signed)
    NURSING HOME LOCATION:  [facility] ROOM NUMBER:  [id number]  CODE STATUS:  DNR  PCP:  Pecola Lawless, MD  [address] [place] [address]  This is a nursing facility follow up of chronic medical diagnoses.    Interim medical record and care since last [facility] visit was updated with review of diagnostic studies and change in clinical status since last visit were documented.  HPI:  The patient is a permanent resident at this facility with medical diagnoses of essential hypertension, GERD, advanced rheumatoid arthritis, dyslipidemia, chronic pain syndrome, major depression, and psychoses and hallucinations in the context of Parkinson's. Dr. Windle Guard her Neurologist has been monitoring & treating her Parkinson's. Ritta Slot NP for the SNF has addressed the hallucinations. The patient can provide no meaningful history. She told me today that she has thrown away all her medications. She believes the Sinemet caused hallucinations (she remains on Carbidopa-Levodopa ER 2 pills qid). She states that she has intermittent headaches due to "trailers leaking out from (my) head" she is "not sure of other concerns". There are no current labs in Epic.  Review of systems: Dementia invalidated responses as noted Date given as 12/27/2017. Psych NP felt no change indicated in psychotropic regimen.  Physical exam:  Pertinent or positive findings: She sits in the wheelchair slumped forward with torticollis of the neck to the left. Dry rales are noted on auscultation of the chest. Pedal pulses are not palpable. She has severe contractures of the hands with rheumatoid deformities. Limb atrophy is present. She has essentially no strength or range of motion in the lower extremities. She has excoriations over the dorsum of the hands.  General appearance:  no acute distress, increased work of breathing is present.   Lymphatic: No lymphadenopathy about the head, neck, axilla. Eyes: No  conjunctival inflammation or lid edema is present. There is no scleral icterus. Ears:  External ear exam shows no significant lesions or deformities.   Nose:  External nasal examination shows no deformity or inflammation. Nasal mucosa are pink and moist without lesions, exudates Oral exam:  Lips and gums are healthy appearing.  Neck:  No thyromegaly, masses, tenderness noted.    Heart:  Normal rate and regular rhythm. S1 and S2 normal without gallop, murmur, click, rub .  Lungs:  without wheezes, rhonchi, rubs. Abdomen: Bowel sounds are normal. Abdomen is soft and nontender with no organomegaly, hernias, masses. GU: Deferred  Extremities:  No cyanosis, clubbing, edema  Neurologic exam : Balance, Rhomberg, finger to nose testing could not be completed due to clinical state Skin: Warm & dry w/o tenting. No significant rash.  See summary under each active problem in the Problem List with associated updated therapeutic plan

## 2017-07-29 NOTE — Assessment & Plan Note (Addendum)
Most recent notes by Jacqulyn Bath ,Psych NP ,[facility] reviewed Recommendation: no change in meds

## 2017-07-31 NOTE — Patient Instructions (Signed)
See assessment and plan under each diagnosis in the problem list and acutely for this visit 

## 2017-08-12 ENCOUNTER — Non-Acute Institutional Stay (SKILLED_NURSING_FACILITY): Payer: Medicare Other

## 2017-08-12 DIAGNOSIS — Z0000 Encounter for general adult medical examination without abnormal findings: Secondary | ICD-10-CM | POA: Diagnosis not present

## 2017-08-12 NOTE — Progress Notes (Signed)
Subjective:   Sheri Molina is a 84 y.o. female who presents for Medicare Annual (Subsequent) preventive examination at [facility]  Last AWV-08/11/2016       Objective:     Vitals: BP 122/60 (BP Location: Right Arm, Patient Position: Sitting)   Pulse 62   Temp 97.8 F (36.6 C) (Oral)   Ht 5\' 4"  (1.626 m)   Wt 110 lb (49.9 kg)   BMI 18.88 kg/m   Body mass index is 18.88 kg/m.  Advanced Directives 08/12/2017 07/29/2017 04/08/2017 01/28/2017 10/16/2016 08/11/2016 07/02/2016  Does Patient Have a Medical Advance Directive? Yes Yes Yes Yes Yes Yes Yes  Type of Advance Directive Out of facility DNR (pink MOST or yellow form) Out of facility DNR (pink MOST or yellow form) Out of facility DNR (pink MOST or yellow form) Out of facility DNR (pink MOST or yellow form) Out of facility DNR (pink MOST or yellow form) Out of facility DNR (pink MOST or yellow form) Out of facility DNR (pink MOST or yellow form)  Does patient want to make changes to medical advance directive? No - Patient declined No - Patient declined No - Patient declined No - Patient declined No - Patient declined No - Patient declined No - Patient declined  Copy of Healthcare Power of Attorney in Chart? - - - - - - -  Pre-existing out of facility DNR order (yellow form or pink MOST form) Yellow form placed in chart (order not valid for inpatient use) - - - - Yellow form placed in chart (order not valid for inpatient use);Pink MOST form placed in chart (order not valid for inpatient use) -    Tobacco Social History   Tobacco Use  Smoking Status Never Smoker  Smokeless Tobacco Never Used     Counseling given: Not Answered   Clinical Intake:  Pre-visit preparation completed: No  Pain : No/denies pain     Nutritional Risks: None Diabetes: No  How often do you need to have someone help you when you read instructions, pamphlets, or other written materials from your doctor or pharmacy?: 3 - Sometimes What is the  last grade level you completed in school?: 11th     Information entered by :: [phone], RN  Past Medical History:  Diagnosis Date  . Allergic rhinitis, cause unspecified   . Chronic kidney disease, unspecified   . Degeneration of lumbar or lumbosacral intervertebral disc   . Diaphragmatic hernia without mention of obstruction or gangrene   . Generalized osteoarthrosis, involving multiple sites   . GERD (gastroesophageal reflux disease)   . Hyperlipidemia   . Hypertension   . Major depressive disorder, recurrent episode, moderate (HCC)   . Myalgia and myositis, unspecified   . Parkinson disease (HCC)   . Peripheral vascular disease, unspecified (HCC)   . Rheumatoid arthritis(714.0)   . Senile osteoporosis   . Unspecified constipation   . 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 Urinary tract infection, site not specified    Past Surgical History:  Procedure Laterality Date  . APPENDECTOMY     Family History  Problem Relation Age of Onset  . Stroke Mother        died @ 14  . Cancer Sister        Unknown primary. Sister also had mental illness  . Heart disease Neg Hx   . Diabetes Neg Hx  Social History   Socioeconomic History  . Marital status: Unknown    Spouse name: Not on file  . Number of children: 6  . Years of education: Not on file  . Highest education level: Not on file  Occupational History  . Not on file  Social Needs  . Financial resource strain: Not hard at all  . Food insecurity:    Worry: Never true    Inability: Never true  . Transportation needs:    Medical: No    Non-medical: No  Tobacco Use  . Smoking status: Never Smoker  . Smokeless tobacco: Never Used  Substance and Sexual Activity  . Alcohol use: No    Alcohol/week: 0.0 standard drinks  . Drug use: No  . Sexual activity: Not on file  Lifestyle  . Physical activity:     Days per week: 2 days    Minutes per session: 30 min  . Stress: Only a little  Relationships  . Social connections:    Talks on phone: Never    Gets together: Twice a week    Attends religious service: Never    Active member of club or organization: No    Attends meetings of clubs or organizations: Never    Relationship status: Not on file  Other Topics Concern  . Not on file  Social History Narrative  . Not on file    Outpatient Encounter Medications as of 08/12/2017  Medication Sig  . amantadine (SYMMETREL) 100 MG capsule Take 100 mg by mouth 2 (two) times daily.  . AMBULATORY NON FORMULARY MEDICATION Magic Cup by mouth twice daily  . AMBULATORY NON FORMULARY MEDICATION Occusoft Baby eyelid towelette 1 wipe every night  . Apomorphine HCl (APOKYN) 30 MG/3ML SOCT Inject into the skin. Inject 0.2 ml (2mg ) SQ up to five times daily as needed for increased parkinson's symptoms  . bisacodyl (DULCOLAX) 10 MG suppository Place 10 mg rectally as needed for moderate constipation.  . Carbidopa-Levodopa ER 36.25-145 MG CPCR Take 2 capsules by mouth 4 (four) times daily.   . cetirizine (ZYRTEC) 10 MG tablet Take 10 mg by mouth daily.  . Cholecalciferol (VITAMIN D3) 50000 units TABS Take by mouth. Take 1 tablet once a month  . Cranberry Juice Powder 425 MG CAPS Take 1 capsule by mouth daily.  03-21-2003 docusate sodium (COLACE) 100 MG capsule Take 100 mg by mouth daily.  . DULoxetine (CYMBALTA) 60 MG capsule Take 60 mg by mouth daily.   . fentaNYL (DURAGESIC - DOSED MCG/HR) 25 MCG/HR patch Apply one patch topically every 72 hours. Remove old patch before placement. Rotate sites  . hydrocortisone (ANUSOL-HC) 2.5 % rectal cream Place 1 application rectally 2 (two) times daily as needed for hemorrhoids.   Marland Kitchen HYDROmorphone (DILAUDID) 4 MG tablet Take one tablet by mouth every 8 hours as needed for pain.  . Liniments (SALONPAS PAIN RELIEF PATCH EX) Apply 1 patch topically every 8 (eight) hours as needed.  .  Magnesium Hydroxide (MILK OF MAGNESIA PO) Take 30 mLs by mouth as needed.  . Omega-3 Fatty Acids (FISH OIL) 1000 MG CAPS Take 1 capsule by mouth daily.   Marland Kitchen omeprazole (PRILOSEC) 40 MG capsule Take 40 mg by mouth daily.  Marland Kitchen Shell 500 MG TABS Take 500 mg by mouth 2 (two) times daily.  Jeralyn Bennett Pimavanserin Tartrate (NUPLAZID PO) Take 34 mg by mouth daily.   . polyethylene glycol powder (GLYCOLAX/MIRALAX) powder Take 17 g by mouth at bedtime.   . pramipexole (  MIRAPEX) 1 MG tablet Take 2 mg by mouth 3 (three) times daily.   . prednisoLONE 5 MG TABS tablet Take 5 mg by mouth daily.  . Sodium Fluoride (CLINPRO 5000) 1.1 % PSTE Place 1 application onto teeth every evening.   No facility-administered encounter medications on file as of 08/12/2017.     Activities of Daily Living In your present state of health, do you have any difficulty performing the following activities: 08/12/2017  Hearing? N  Vision? N  Difficulty concentrating or making decisions? Y  Walking or climbing stairs? Y  Dressing or bathing? Y  Doing errands, shopping? Y  Preparing Food and eating ? Y  Using the Toilet? Y  In the past six months, have you accidently leaked urine? Y  Do you have problems with loss of bowel control? Y  Managing your Medications? Y  Managing your Finances? Y  Housekeeping or managing your Housekeeping? Y  Some recent data might be hidden    Patient Care Team: Pecola Lawless, MD as PCP - General (Internal Medicine)    Assessment:   This is a routine wellness examination for Valentyna.  Exercise Activities and Dietary recommendations Current Exercise Habits: The patient does not participate in regular exercise at present, Exercise limited by: orthopedic condition(s)  Goals   None     Fall Risk Fall Risk  08/12/2017 08/11/2016  Falls in the past year? No No   Is the patient's home free of loose throw rugs in walkways, pet beds, electrical cords, etc?   yes      Grab bars in the bathroom?  yes      Handrails on the stairs?   yes      Adequate lighting?   yes  Depression Screen PHQ 2/9 Scores 08/12/2017 08/11/2016  PHQ - 2 Score 1 0     Cognitive Function     6CIT Screen 08/12/2017 08/11/2016  What Year? 4 points 4 points  What month? 0 points 0 points  What time? 0 points 0 points  Count back from 20 0 points 0 points  Months in reverse 0 points 0 points  Repeat phrase 8 points 8 points  Total Score 12 12    Immunization History  Administered Date(s) Administered  . Influenza-Unspecified 10/03/2015    Qualifies for Shingles Vaccine? Not in past records  Screening Tests Health Maintenance  Topic Date Due  . INFLUENZA VACCINE  09/10/2017 (Originally 08/05/2017)  . PNA vac Low Risk Adult (1 of 2 - PCV13) 01/28/2018 (Originally 12/07/1996)  . TETANUS/TDAP  05/05/2018 (Originally 12/08/1948)  . DEXA SCAN  01/06/2023 (Originally 12/07/1996)    Cancer Screenings: Lung: Low Dose CT Chest recommended if Age 92-80 years, 30 pack-year currently smoking OR have quit w/in 15years. Patient does not qualify. Breast:  Up to date on Mammogram? excluded Up to date of Bone Density/Dexa? Excluded, patient is nonambulaory Colorectal: excluded  Additional Screenings:  Hepatitis C Screening: declined TDAP due: ordered Pneumovax due: ordered    Plan:    I have personally reviewed and addressed the Medicare Annual Wellness questionnaire and have noted the following in the patient's chart:  A. Medical and social history B. Use of alcohol, tobacco or illicit drugs  C. Current medications and supplements D. Functional ability and status E.  Nutritional status F.  Physical activity G. Advance directives H. List of other physicians I.  Hospitalizations, surgeries, and ER visits in previous 12 months J.  Vitals K. Screenings to include hearing, vision, cognitive,  depression L. Referrals and appointments - none  In addition, I have reviewed and discussed with patient certain  preventive protocols, quality metrics, and best practice recommendations. A written personalized care plan for preventive services as well as general preventive health recommendations were provided to patient.  See attached scanned questionnaire for additional information.   Signed,   Tyron Russell, RN Nurse Health Advisor  Patient Concern: None

## 2017-08-12 NOTE — Patient Instructions (Signed)
[name] , Thank you for taking time to come for your Medicare Wellness Visit. I appreciate your ongoing commitment to your health goals. Please review the following plan we discussed and let me know if I can assist you in the future.   Screening recommendations/referrals: Colonoscopy excluded, over age 82 Mammogram excluded, over age 50 Bone Density excluded, patient is unambulatory Recommended yearly ophthalmology/optometry visit for glaucoma screening and checkup Recommended yearly dental visit for hygiene and checkup  Vaccinations: Influenza vaccine up to date, due 2019 fall season Pneumococcal vaccine 23 due, ordered Tdap vaccine due, ordered Shingles vaccine not in records    Advanced directives: in chart  Conditions/risks identified: none  Next appointment: Dr. Alwyn Ren makes rounds   Preventive Care 65 Years and Older, Female Preventive care refers to lifestyle choices and visits with your health care provider that can promote health and wellness. What does preventive care include?  A yearly physical exam. This is also called an annual well check.  Dental exams once or twice a year.  Routine eye exams. Ask your health care provider how often you should have your eyes checked.  Personal lifestyle choices, including:  Daily care of your teeth and gums.  Regular physical activity.  Eating a healthy diet.  Avoiding tobacco and drug use.  Limiting alcohol use.  Practicing safe sex.  Taking low-dose aspirin every day.  Taking vitamin and mineral supplements as recommended by your health care provider. What happens during an annual well check? The services and screenings done by your health care provider during your annual well check will depend on your age, overall health, lifestyle risk factors, and family history of disease. Counseling  Your health care provider may ask you questions about your:  Alcohol use.  Tobacco use.  Drug use.  Emotional  well-being.  Home and relationship well-being.  Sexual activity.  Eating habits.  History of falls.  Memory and ability to understand (cognition).  Work and work Astronomer.  Reproductive health. Screening  You may have the following tests or measurements:  Height, weight, and BMI.  Blood pressure.  Lipid and cholesterol levels. These may be checked every 5 years, or more frequently if you are over 101 years old.  Skin check.  Lung cancer screening. You may have this screening every year starting at age 14 if you have a 30-pack-year history of smoking and currently smoke or have quit within the past 15 years.  Fecal occult blood test (FOBT) of the stool. You may have this test every year starting at age 36.  Flexible sigmoidoscopy or colonoscopy. You may have a sigmoidoscopy every 5 years or a colonoscopy every 10 years starting at age 71.  Hepatitis C blood test.  Hepatitis B blood test.  Sexually transmitted disease (STD) testing.  Diabetes screening. This is done by checking your blood sugar (glucose) after you have not eaten for a while (fasting). You may have this done every 1-3 years.  Bone density scan. This is done to screen for osteoporosis. You may have this done starting at age 22.  Mammogram. This may be done every 1-2 years. Talk to your health care provider about how often you should have regular mammograms. Talk with your health care provider about your test results, treatment options, and if necessary, the need for more tests. Vaccines  Your health care provider may recommend certain vaccines, such as:  Influenza vaccine. This is recommended every year.  Tetanus, diphtheria, and acellular pertussis (Tdap, Td) vaccine. You may  need a Td booster every 10 years.  Zoster vaccine. You may need this after age 60.  Pneumococcal 13-valent conjugate (PCV13) vaccine. One dose is recommended after age 31.  Pneumococcal polysaccharide (PPSV23) vaccine. One  dose is recommended after age 33. Talk to your health care provider about which screenings and vaccines you need and how often you need them. This information is not intended to replace advice given to you by your health care provider. Make sure you discuss any questions you have with your health care provider. Document Released: 01/18/2015 Document Revised: 09/11/2015 Document Reviewed: 10/23/2014 Elsevier Interactive Patient Education  2017 [place] Prevention in the Home Falls can cause injuries. They can happen to people of all ages. There are many things you can do to make your home safe and to help prevent falls. What can I do on the outside of my home?  Regularly fix the edges of walkways and driveways and fix any cracks.  Remove anything that might make you trip as you walk through a door, such as a raised step or threshold.  Trim any bushes or trees on the path to your home.  Use bright outdoor lighting.  Clear any walking paths of anything that might make someone trip, such as rocks or tools.  Regularly check to see if handrails are loose or broken. Make sure that both sides of any steps have handrails.  Any raised decks and porches should have guardrails on the edges.  Have any leaves, snow, or ice cleared regularly.  Use sand or salt on walking paths during winter.  Clean up any spills in your garage right away. This includes oil or grease spills. What can I do in the bathroom?  Use night lights.  Install grab bars by the toilet and in the tub and shower. Do not use towel bars as grab bars.  Use non-skid mats or decals in the tub or shower.  If you need to sit down in the shower, use a plastic, non-slip stool.  Keep the floor dry. Clean up any water that spills on the floor as soon as it happens.  Remove soap buildup in the tub or shower regularly.  Attach bath mats securely with double-sided non-slip rug tape.  Do not have throw rugs and other  things on the floor that can make you trip. What can I do in the bedroom?  Use night lights.  Make sure that you have a light by your bed that is easy to reach.  Do not use any sheets or blankets that are too big for your bed. They should not hang down onto the floor.  Have a firm chair that has side arms. You can use this for support while you get dressed.  Do not have throw rugs and other things on the floor that can make you trip. What can I do in the kitchen?  Clean up any spills right away.  Avoid walking on wet floors.  Keep items that you use a lot in easy-to-reach places.  If you need to reach something above you, use a strong step stool that has a grab bar.  Keep electrical cords out of the way.  Do not use floor polish or wax that makes floors slippery. If you must use wax, use non-skid floor wax.  Do not have throw rugs and other things on the floor that can make you trip. What can I do with my stairs?  Do not leave any items on  the stairs.  Make sure that there are handrails on both sides of the stairs and use them. Fix handrails that are broken or loose. Make sure that handrails are as long as the stairways.  Check any carpeting to make sure that it is firmly attached to the stairs. Fix any carpet that is loose or worn.  Avoid having throw rugs at the top or bottom of the stairs. If you do have throw rugs, attach them to the floor with carpet tape.  Make sure that you have a light switch at the top of the stairs and the bottom of the stairs. If you do not have them, ask someone to add them for you. What else can I do to help prevent falls?  Wear shoes that:  Do not have high heels.  Have rubber bottoms.  Are comfortable and fit you well.  Are closed at the toe. Do not wear sandals.  If you use a stepladder:  Make sure that it is fully opened. Do not climb a closed stepladder.  Make sure that both sides of the stepladder are locked into place.  Ask  someone to hold it for you, if possible.  Clearly mark and make sure that you can see:  Any grab bars or handrails.  First and last steps.  Where the edge of each step is.  Use tools that help you move around (mobility aids) if they are needed. These include:  Canes.  Walkers.  Scooters.  Crutches.  Turn on the lights when you go into a dark area. Replace any light bulbs as soon as they burn out.  Set up your furniture so you have a clear path. Avoid moving your furniture around.  If any of your floors are uneven, fix them.  If there are any pets around you, be aware of where they are.  Review your medicines with your doctor. Some medicines can make you feel dizzy. This can increase your chance of falling. Ask your doctor what other things that you can do to help prevent falls. This information is not intended to replace advice given to you by your health care provider. Make sure you discuss any questions you have with your health care provider. Document Released: 10/18/2008 Document Revised: 05/30/2015 Document Reviewed: 01/26/2014 Elsevier Interactive Patient Education  2017 [employer].

## 2017-10-05 ENCOUNTER — Non-Acute Institutional Stay (SKILLED_NURSING_FACILITY): Payer: Medicare Other | Admitting: Internal Medicine

## 2017-10-05 ENCOUNTER — Encounter: Payer: Self-pay | Admitting: Internal Medicine

## 2017-10-05 DIAGNOSIS — Z9189 Other specified personal risk factors, not elsewhere classified: Secondary | ICD-10-CM

## 2017-10-05 DIAGNOSIS — F060 Psychotic disorder with hallucinations due to known physiological condition: Secondary | ICD-10-CM | POA: Diagnosis not present

## 2017-10-05 DIAGNOSIS — I10 Essential (primary) hypertension: Secondary | ICD-10-CM

## 2017-10-05 DIAGNOSIS — G20 Parkinson's disease: Secondary | ICD-10-CM
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
## 2017-10-05 NOTE — Assessment & Plan Note (Addendum)
Dr. Quentin Mulling, Neurology will be notified of her present mental status Based on my exam 10/05/2017, I seriously doubt the patient is able to communicate pain Slow controlled substance weaning should be considered as a trial

## 2017-10-05 NOTE — Assessment & Plan Note (Signed)
As per Dr Quentin Mulling

## 2017-10-05 NOTE — Assessment & Plan Note (Signed)
BP controlled; no change in antihypertensive medications  

## 2017-10-05 NOTE — Assessment & Plan Note (Signed)
10/05/2017 deprescribing should be considered based on her present mental status and high risk of respiratory suppression As she has used hydromorphone only on one occasion in September, this will be discontinued.  Based on clinical status after discontinuation of this medication, slow wean of the fentanyl could be considered.

## 2017-10-06 NOTE — Patient Instructions (Signed)
See assessment and plan under each diagnosis in the problem list and acutely for this visit 

## 2018-02-01 ENCOUNTER — Encounter: Payer: Self-pay | Admitting: Internal Medicine

## 2018-02-01 ENCOUNTER — Non-Acute Institutional Stay (SKILLED_NURSING_FACILITY): Payer: Medicare Other | Admitting: Internal Medicine

## 2018-02-01 DIAGNOSIS — G20 Parkinson's disease: Secondary | ICD-10-CM | POA: Diagnosis not present

## 2018-02-01 DIAGNOSIS — K5903 Drug induced constipation: Secondary | ICD-10-CM

## 2018-02-01 DIAGNOSIS — G20A1 Parkinson's disease without dyskinesia, without mention of fluctuations: Secondary | ICD-10-CM

## 2018-02-01 DIAGNOSIS — Z9189 Other specified personal risk factors, not elsewhere classified: Secondary | ICD-10-CM | POA: Diagnosis not present

## 2018-02-01 NOTE — Assessment & Plan Note (Addendum)
Most likely related to the opiates, but TSH is not current

## 2018-02-01 NOTE — Assessment & Plan Note (Signed)
Hydromorphone which I had discontinued was restarted, possibly by [facility] NP

## 2018-02-01 NOTE — Assessment & Plan Note (Addendum)
She denies any associated hallucinations at this time Any neurologic follow-up needs to be confirmed

## 2018-02-01 NOTE — Patient Instructions (Signed)
See assessment and plan under each diagnosis in the problem list and acutely for this visit 

## 2018-02-01 NOTE — Progress Notes (Signed)
NURSING HOME LOCATION:  [facility] ROOM NUMBER:  127-A  CODE STATUS:  DNR  PCP:  Pecola LawlessHopper, Jaquesha Boroff F, MD  [address] [place] [id number]  This is a nursing facility follow up of chronic medical diagnoses.   Interim medical record and care since [facility] was updated with review of diagnostic studies and change in clinical status since last visit were documented.  HPI: She is a permanent resident of the facility with medical diagnoses of Parkinsons for which she is followed by Dr. Windle GuardHaworth, Neurologist.  She is also followed by the psychiatric nurse practitioner because of the hallucinations.  Other medical diagnoses include GERD, advanced rheumatoid arthritis, dyslipidemia, chronic pain syndrome on opiates, major depression, and essential hypertension.  Review of systems: Her responses are questionable as she gave the date as December 29, 2018.  She was not sure whether tomorrow was Christmas.  She states that she has not gone back to her Neurologist as "he said (I) was "doing fine" and she "cannot afford the visits".  Despite these comments she is on 3 different parkinsonian medications.  She is on Nuplazid for parkinsonian psychosis.  Describes some mid to lower back pain for 2 days.  Surprisingly she does not validate having neck pain despite her torticollis.  She describes some anxiety and depression.  Her responses are unfocused and rambling.  Constitutional: No fever, significant weight change, fatigue  Eyes: No redness, discharge, pain, vision change ENT/mouth: No nasal congestion,  purulent discharge, earache, change in hearing, sore throat  Cardiovascular: No chest pain, palpitations, paroxysmal nocturnal dyspnea, claudication, edema  Respiratory: No cough, sputum production, hemoptysis, DOE, significant snoring, apnea   Gastrointestinal: No heartburn, dysphagia, abdominal pain, nausea /vomiting, rectal bleeding, melena, change in bowels Genitourinary:  No dysuria, hematuria, pyuria, incontinence, nocturia Dermatologic: No rash, pruritus, change in appearance of skin Neurologic: No dizziness, headache, syncope, seizures, numbness, tingling Endocrine: No change in hair/skin/nails, excessive thirst, excessive hunger, excessive urination  Hematologic/lymphatic: No significant bruising, lymphadenopathy, abnormal bleeding Allergy/immunology: No itchy/watery eyes, significant sneezing, urticaria, angioedema  Physical exam:  Pertinent or positive findings: She sits in the wheelchair. She speaks in a whisper.  She has dramatic torticollis with the head & neck deviated to the left.  Her head actually sits on her left shoulder.  There is accentuated curvature of the thoracic spine.  Height vertically is decreased and her thorax actually sits on her hips.  Heart sounds are distant.  She has diffuse homogenous dry rales.  Pedal pulses are not palpable.  She has no strength to opposition in any extremity.  Limb atrophy is present.  She has dramatic contractions of her hands.  Both hands are in braces.  She has splotchy erythema over the dorsum of the right hand.  General appearance: no acute distress, increased work of breathing is present.   Lymphatic: No lymphadenopathy about the head, neck, axilla. Eyes: No conjunctival inflammation or lid edema is present. There is no scleral icterus. Ears:  External ear exam shows no significant lesions or deformities.   Nose:  External nasal examination shows no deformity or inflammation. Nasal mucosa are pink and moist without lesions, exudates Oral exam:  Lips and gums are healthy appearing. There is no oropharyngeal erythema or exudate. Neck:  No thyromegaly, masses, tenderness noted.    Heart:  No gallop, murmur, click, rub .  Lungs:  without wheezes, rhonchi, rubs. Abdomen: Bowel sounds are normal. Abdomen is soft and nontender with no  organomegaly, hernias, masses. GU: Deferred  Extremities:  No cyanosis,  clubbing, edema  Neurologic exam : Balance, Rhomberg, finger to nose testing could not be completed due to clinical state Skin: Warm & dry w/o tenting. No significant  rash.  See summary under each active problem in the Problem List with associated updated therapeutic plan.

## 2018-02-17 LAB — BASIC METABOLIC PANEL
BUN: 19 (ref 4–21)
Creatinine: 0.7 (ref 0.5–1.1)
Glucose: 65
Potassium: 4.6 (ref 3.4–5.3)
Sodium: 143 (ref 137–147)

## 2018-02-17 LAB — CBC AND DIFFERENTIAL
HCT: 38 (ref 36–46)
Hemoglobin: 12.7 (ref 12.0–16.0)
Platelets: 227 (ref 150–399)
WBC: 6.9

## 2018-04-12 ENCOUNTER — Encounter: Payer: Self-pay | Admitting: Internal Medicine

## 2018-04-12 ENCOUNTER — Non-Acute Institutional Stay (SKILLED_NURSING_FACILITY): Payer: Medicare Other | Admitting: Internal Medicine

## 2018-04-12 DIAGNOSIS — M069 Rheumatoid arthritis, unspecified: Secondary | ICD-10-CM

## 2018-04-12 DIAGNOSIS — G894 Chronic pain syndrome: Secondary | ICD-10-CM | POA: Diagnosis not present

## 2018-04-12 DIAGNOSIS — F060 Psychotic disorder with hallucinations due to known physiological condition: Secondary | ICD-10-CM

## 2018-04-12 DIAGNOSIS — I10 Essential (primary) hypertension: Secondary | ICD-10-CM

## 2018-04-12 DIAGNOSIS — F331 Major depressive disorder, recurrent, moderate: Secondary | ICD-10-CM | POA: Diagnosis not present

## 2018-04-12 NOTE — Assessment & Plan Note (Addendum)
I will asked the DONto confirm frequency of the as needed Dilaudid.  It will be discontinued because of respiratory suppression & drug:drug interaction risks if not taken.

## 2018-04-12 NOTE — Assessment & Plan Note (Signed)
See 04/12/2018 patient confabulates and hallucinates but this is not alarming to her.  She does describe some anxiety and depression.  Affect is flat.  Psych NP follow-up will continue.

## 2018-04-12 NOTE — Assessment & Plan Note (Signed)
Clinically stable, but psych NP will continue to follow

## 2018-04-12 NOTE — Assessment & Plan Note (Addendum)
BP controlled; no indication for antihypertensive medications  
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
## 2018-04-13 NOTE — Patient Instructions (Signed)
See assessment and plan under each diagnosis in the problem list and acutely for this visit 

## 2018-04-19 LAB — BASIC METABOLIC PANEL  EGFR
Calcium: 9.7
Carbon Dioxide, Total: 22
Chloride: 102

## 2018-07-26 ENCOUNTER — Non-Acute Institutional Stay (SKILLED_NURSING_FACILITY): Payer: Medicare Other | Admitting: Internal Medicine

## 2018-07-26 ENCOUNTER — Encounter: Payer: Self-pay | Admitting: Internal Medicine

## 2018-07-26 DIAGNOSIS — Z9189 Other specified personal risk factors, not elsewhere classified: Secondary | ICD-10-CM | POA: Diagnosis not present

## 2018-07-26 DIAGNOSIS — G20 Parkinson's disease: Secondary | ICD-10-CM

## 2018-07-26 DIAGNOSIS — F060 Psychotic disorder with hallucinations due to known physiological condition: Secondary | ICD-10-CM | POA: Diagnosis not present

## 2018-07-26 NOTE — Assessment & Plan Note (Signed)
Neurology follow-up will be pursued as soon as the corona quarantine is lifted at the SNF.

## 2018-07-26 NOTE — Patient Instructions (Signed)
See assessment and plan under each diagnosis in the problem list and acutely for this visit 

## 2018-07-26 NOTE — Assessment & Plan Note (Addendum)
07/26/2018 patient can give no coherent history and hallucinates and confabulates nonsensically.  Neurology and Psychiatry consultants should be involved for their expertise in optimizing efficacy of her polypharmacy regimen to eliminate ADEs.

## 2018-07-26 NOTE — Assessment & Plan Note (Addendum)
Multidisciplinary involvement of Neurology, Psychiatry, and Pharmacy would be appropriate to address the risks of present polypharmacy.

## 2018-07-26 NOTE — Progress Notes (Signed)
   NURSING HOME LOCATION:  [facility] ROOM NUMBER:  127-A  CODE STATUS:  DNR  PCP:  Hendricks Limes, MD  [place] [address]   This is a nursing facility follow up of chronic medical diagnoses.  Interim medical record and care since last [place] visit was updated with review of diagnostic studies and change in clinical status since last visit were documented.  HPI: She is a permanent resident of the facility with an incredibly complicated medical history.  Diagnoses include advanced rheumatoid arthritis, peripheral vascular disease, Parkinson's disease with hallucinations, major depression, essential hypertension, dyslipidemia, GERD, CKD.  Review of systems: Dementia invalidated responses.  Patient does validate she has to be fed because of her hand contractures.  She states that "the man who passes out pills pushes them in with applesauce when I refuse them".   She had no specific physical complaints but rambled incoherently on multiple topics.  She states that "the Pope came to visit 2 days ago".  When asked why he had come, she stated it was to "feather his own bed".  She stated "he asked many questions prior to his departure".  She states that she is continually receiving "excellent surprises" ,meaning that she has multiple visitors. (Note: The facility is under Cor[employer] virus [employer] and no visitors are allowed).   When I asked why we were wearing masks she stated that it was "for the party".   She went on to talk about a man visiting her from [facility] and also mentioned being visited by her husband. She is widowed.   Physical exam:  Pertinent or positive findings: She is cachectic and debilitated.  She sits in the wheelchair with a rocking, athetoid type movement of the trunk and head.  Heart rhythm is irregular.  She has dry rales at the bases.  Breath sounds are otherwise decreased.  Pedal pulses are decreased.  She has trace edema at the  sock line.  She is weak to opposition especially in the upper extremities.  The right lower extremity is weaker than the left.  General appearance: no acute distress, increased work of breathing is present.   Lymphatic: No lymphadenopathy about the head, neck, axilla. Eyes: No conjunctival inflammation or lid edema is present. There is no scleral icterus. Ears:  External ear exam shows no significant lesions or deformities.   Nose:  External nasal examination shows no deformity or inflammation. Nasal mucosa are pink and moist without lesions, exudates Neck:  No thyromegaly, masses, tenderness noted.    Heart:  No gallop, murmur, click, rub .  Lungs:  without wheezes, rhonchi, rubs. Abdomen: Bowel sounds are normal. Abdomen is soft and nontender with no organomegaly, hernias, masses. GU: Deferred  Extremities:  No cyanosis, clubbing  Neurologic exam : Balance, Rhomberg, finger to nose testing could not be completed due to clinical state Skin: Warm & dry  No significant lesions or rash.  See summary under each active problem in the Problem List with associated updated therapeutic plan

## 2018-10-11 ENCOUNTER — Non-Acute Institutional Stay (SKILLED_NURSING_FACILITY): Payer: Medicare Other | Admitting: Internal Medicine

## 2018-10-11 ENCOUNTER — Encounter: Payer: Self-pay | Admitting: Internal Medicine

## 2018-10-11 DIAGNOSIS — I10 Essential (primary) hypertension: Secondary | ICD-10-CM

## 2018-10-11 DIAGNOSIS — F060 Psychotic disorder with hallucinations due to known physiological condition: Secondary | ICD-10-CM

## 2018-10-11 DIAGNOSIS — R296 Repeated falls: Secondary | ICD-10-CM

## 2018-10-11 NOTE — Progress Notes (Addendum)
   NURSING HOME LOCATION:  [facility] ROOM NUMBER:  127-A  CODE STATUS:  DNR  PCP:  Hendricks Limes, MD  [place] [address]   This is a nursing facility follow up of chronic medical diagnoses.    Interim medical record and care since last [place] visit was updated with review of diagnostic studies and change in clinical status since last visit were documented.  HPI: She is a permanent resident of the facility with medical diagnoses of Parkinson's disease associated with hallucinations, rheumatoid arthritis, senile osteoporosis, PVD, essential hypertension, dyslipidemia, GERD, and CKD.  [facility] NP reports that the patient did have a fall from her wheelchair.  OT has actually documented 2 falls from the chair within the [place].  No musculoskeletal injury was documented. Labs are not current because of the COVID-19 quarantine.  Review of systems: Could not be completed because of hallucinations and confabulation.  When asked as to any specific complaints she rambled on about "if it is refrigerated, back around".  She mentioned having "family problems, raising teenagers".  As noted she is 85 years old.  She went on to discuss something about an individual "robbing and rioting a bank" she was unable to provide any focused answer to any queries.  Physical exam:  Pertinent or positive findings: She exhibits torticollis to the left.  She has rotatory tremor of her head and trunk.  Breath sounds are decreased with scattered low-grade musical rhonchi.  Heart rhythm is slightly irregular but not bradycardic.Bowel sounds are hyperactive.  Abdomen is protuberant.  She has trace pedal edema.  Pedal pulses are decreased.  She states that she cannot lift her arms but does have some limited range of motion of the upper extremities.  She would not move the legs to opposition.  She has severe contractures of the hands.  The right hand is in a brace.    General appearance:no  acute distress, increased work of breathing is present.   Lymphatic: No lymphadenopathy about the head, neck, axilla. Eyes: No conjunctival inflammation or lid edema is present. There is no scleral icterus. Ears:  External ear exam shows no significant lesions or deformities.   Nose:  External nasal examination shows no deformity or inflammation. Nasal mucosa are pink and moist without lesions, exudates Oral exam:  Lips and gums are healthy appearing.  Neck:  No thyromegaly, masses, tenderness noted.    Heart:  No gallop, murmur, click, rub .  Lungs:  without wheezes, rhonchi, rales, rubs. Abdomen: Abdomen is soft and nontender with no organomegaly, hernias, masses. GU: Deferred  Extremities:  No cyanosis, clubbing, edema  Neurologic exam : Balance, Rhomberg, finger to nose testing could not be completed due to clinical state Skin: Warm & dry w/o tenting. No significant lesions or rash.  See summary under each active problem in the Problem List with associated updated therapeutic plan.See med adjustments due to falls.

## 2018-10-11 NOTE — Assessment & Plan Note (Addendum)
Psych NP will continue to monitor and adjust the psychotropic medications.  No behavioral issues have been documented beyond the hallucinations.

## 2018-10-11 NOTE — Patient Instructions (Signed)
See assessment and plan under each diagnosis in the problem list and acutely for this visit 

## 2018-10-11 NOTE — Assessment & Plan Note (Addendum)
Blood pressure remains adequately controlled without antihypertensive medications.  She exhibits bradycardia but is on no rate controlling CCB or beta-blocker.  TSH was last checked in 2018.  It will be updated.

## 2018-10-11 NOTE — Assessment & Plan Note (Addendum)
OT assessment Begin monthly reductions in narcotics beyond the fentanyl.

## 2019-02-14 ENCOUNTER — Encounter: Payer: Self-pay | Admitting: Internal Medicine

## 2019-02-14 ENCOUNTER — Non-Acute Institutional Stay (SKILLED_NURSING_FACILITY): Payer: Medicare Other | Admitting: Internal Medicine

## 2019-02-14 DIAGNOSIS — G20 Parkinson's disease: Secondary | ICD-10-CM

## 2019-02-14 DIAGNOSIS — I10 Essential (primary) hypertension: Secondary | ICD-10-CM | POA: Diagnosis not present

## 2019-02-14 DIAGNOSIS — F331 Major depressive disorder, recurrent, moderate: Secondary | ICD-10-CM

## 2019-02-14 DIAGNOSIS — M069 Rheumatoid arthritis, unspecified: Secondary | ICD-10-CM | POA: Diagnosis not present

## 2019-02-14 DIAGNOSIS — I739 Peripheral vascular disease, unspecified: Secondary | ICD-10-CM

## 2019-02-14 NOTE — Assessment & Plan Note (Signed)
BP controlled; no change in antihypertensive medications  

## 2019-02-14 NOTE — Patient Instructions (Signed)
See assessment and plan under each diagnosis in the problem list and acutely for this visit 

## 2019-02-14 NOTE — Assessment & Plan Note (Addendum)
Her feet are markedly cool to touch without ischemic changes or skin breakdown.  Amlodipine is a potential option if symptomatic.

## 2019-02-14 NOTE — Progress Notes (Signed)
   NURSING HOME LOCATION:  [facility] ROOM NUMBER: 127/A    CODE STATUS: DNR   PCP: Hendricks Limes MD.   This is a nursing facility follow up of chronic medical diagnoses  Interim medical record and care since last [place] visit was updated with review of diagnostic studies and change in clinical status since last visit were documented.  HPI: She is a permanent resident of the facility with diagnoses of essential hypertension with CKD, RA, peripheral vascular disease with edema, osteoporosis, Parkinson's disease with hallucinations, essential hypertension, dyslipidemia, and extrinsic rhinitis.  Review of systems: Dementia invalidated responses. Date given as November 29, 1999.  She indicates she is doing well with no significant issues at this time although she does validate intermittent joint pain.  She made the comment that she felt "as good as I did back at home".  As we entered the room she was talking to herself.  She asked me if I were young enough to dance and stated "you might have to go to bed to get in better shape".  Physical exam:  Pertinent or positive findings: She appears chronically ill and suboptimally nourished.  She appears her stated age.  She sits in the wheelchair leaning to the left with torticollis of the neck to the left as well.  She has her permanent teeth but there is marked coating with plaque.  Heart sounds are distant.  Breath sounds are decreased in the bases.  Pedal pulses are decreased.  The feet are cool to touch.  She has myriad senile keratoses over the posterior thorax.  She is wearing support garments over the hands.  Flexion contractures and severe rheumatoid changes are present.  She  indicated discomfort when I tried to examine the MCP joints.  She also had a boss over the left anterior axillary area suggesting remote fracture.  There is generalized wasting of the limbs.   General appearance: no acute distress, increased work of  breathing is present.   Lymphatic: No lymphadenopathy about the head, neck, axilla. Eyes: No conjunctival inflammation or lid edema is present. There is no scleral icterus. Ears:  External ear exam shows no significant lesions or deformities.   Nose:  External nasal examination shows no deformity or inflammation. Nasal mucosa are pink and moist without lesions, exudates Neck:  No thyromegaly, masses, tenderness noted.    Heart:  Normal rate and regular rhythm. S1 and S2 normal without gallop, murmur, click, rub .  Lungs: without wheezes, rhonchi, rales, rubs. Abdomen: Bowel sounds are normal. Abdomen is soft and nontender with no organomegaly, hernias, masses. GU: Deferred  Extremities:  No cyanosis, clubbing, edema  Neurologic exam : Balance, Rhomberg, finger to nose testing could not be completed due to clinical state Skin: Warm & dry w/o tenting. No significant rash.  See summary under each active problem in the Problem List with associated updated therapeutic plan

## 2019-02-14 NOTE — Assessment & Plan Note (Signed)
At today's visit she is almost euphoric stating that she has not felt this good since she lived at home.

## 2019-02-14 NOTE — Assessment & Plan Note (Signed)
End-stage rheumatoid joint disease.  Clinically there is no evidence of rheumatoid lung.

## 2019-02-14 NOTE — Assessment & Plan Note (Addendum)
Her Parkinson's is associated with lack of reality testing with hallucinations & nonsensical speech content. Neurology reassessment at least annually is indicated.  A copy of this note will be sent to Dr. Karle Plumber office.

## 2019-05-16 ENCOUNTER — Non-Acute Institutional Stay (SKILLED_NURSING_FACILITY): Payer: Medicare Other | Admitting: Internal Medicine

## 2019-05-16 ENCOUNTER — Encounter: Payer: Self-pay | Admitting: Internal Medicine

## 2019-05-16 DIAGNOSIS — I10 Essential (primary) hypertension: Secondary | ICD-10-CM

## 2019-05-16 DIAGNOSIS — R634 Abnormal weight loss: Secondary | ICD-10-CM

## 2019-05-16 DIAGNOSIS — D692 Other nonthrombocytopenic purpura: Secondary | ICD-10-CM

## 2019-05-16 DIAGNOSIS — F060 Psychotic disorder with hallucinations due to known physiological condition: Secondary | ICD-10-CM | POA: Diagnosis not present

## 2019-05-16 NOTE — Progress Notes (Signed)
   NURSING HOME LOCATION:  [facility] ROOM NUMBER:  127-A  CODE STATUS:  DNR  PCP:  Pecola Lawless, MD  [address] [place] [address]   This is a nursing facility follow up of chronic medical diagnoses.   Interim medical record and care since last [facility] visit was updated with review of diagnostic studies and change in clinical status since last visit were documented.  HPI: She is a permanent resident of facility with medical diagnoses of Parkinson's with psychoses, GERD, chronic pain syndrome in the context of severe/deforming RA, senile osteoporosis, PVD, essential hypertension, CKD, and dyslipidemia. There are no current labs in the medical record.  Review of systems: Dementia invalidated responses.  She persistently confabulates nonsensically.  She has difficulty following simple commands. Her roommate states that the patient is having mood swings and will "tell you off". She states that the patient repeatedly tries to get out of bed or the wheelchair as she feels she can walk.  She also is concerned about the patient's possible weight loss.   She stated that she was having a "lot of bald days".  She inferred that she was having a bladder infection as it was "rotten".  She denies dysuria but only "fullness".  She also admits to incontinence.  She describes chronic breast pain; she states that her husband "cleans them".  She also told me that she had "listering disease".  When I asked her to explain, she stated that this had something to do with "bad breath".  She may have been trying to say Listerine.  Physical exam:  Pertinent or positive findings: She appears chronically ill and suboptimally nourished.  She is in the rolling lounge chair and leans to the left.  Breath sounds & heart sounds are distant/decreased.  Abdomen is protuberant.  Pedal pulses decreased.  She has decreased range of motion of the extremities especially the right upper extremity with  associated weakness.  She has severe rheumatoid contractures of the hands.  General appearance: no acute distress, increased work of breathing is present.   Lymphatic: No lymphadenopathy about the head, neck, axilla. Eyes: No conjunctival inflammation or lid edema is present. There is no scleral icterus. Ears:  External ear exam shows no significant lesions or deformities.   Nose:  External nasal examination shows no deformity or inflammation. Nasal mucosa are pink and moist without lesions, exudates Neck:  No thyromegaly, masses, tenderness noted.    Heart:  No gallop, murmur, click, rub .  Lungs:  without wheezes, rhonchi, rales, rubs. Abdomen: Bowel sounds are normal. Abdomen is soft and nontender with no organomegaly, hernias, masses. GU: Deferred  Extremities:  No cyanosis, clubbing, edema  Neurologic exam :Balance, Rhomberg, finger to nose testing could not be completed due to clinical state Skin: Warm & dry w/o tenting. No significant lesions or rash.  See summary under each active problem in the Problem List with associated updated therapeutic plan

## 2019-05-16 NOTE — Patient Instructions (Signed)
See assessment and plan under each diagnosis in the problem list and acutely for this visit 

## 2019-05-16 NOTE — Assessment & Plan Note (Addendum)
Neurology/ Psychiatry reassessment appears to be indicated in the context of polypharmacy and apparent mood swings as reported by her roommate. Discuss with [facility] NP.

## 2019-05-17 NOTE — Assessment & Plan Note (Signed)
Not clinically significant despite maintenance oral steroids for RA

## 2019-05-17 NOTE — Assessment & Plan Note (Signed)
BP controlled w/o antihypertensive medications  

## 2019-06-20 LAB — HEPATIC FUNCTION PANEL
ALT: 5 — AB (ref 7–35)
AST: 13 (ref 13–35)
Alkaline Phosphatase: 66 (ref 25–125)
Bilirubin, Total: 0.2

## 2019-06-20 LAB — CBC AND DIFFERENTIAL
HCT: 38 (ref 36–46)
Hemoglobin: 12.5 (ref 12.0–16.0)
Neutrophils Absolute: 4
Platelets: 215 (ref 150–399)
WBC: 6.6

## 2019-06-20 LAB — BASIC METABOLIC PANEL
BUN: 23 — AB (ref 4–21)
CO2: 28 — AB (ref 13–22)
Chloride: 104 (ref 99–108)
Creatinine: 0.6 (ref 0.5–1.1)
Glucose: 86
Potassium: 4.4 (ref 3.4–5.3)
Sodium: 143 (ref 137–147)

## 2019-06-20 LAB — COMPREHENSIVE METABOLIC PANEL
Albumin: 4.3 (ref 3.5–5.0)
Calcium: 9.8 (ref 8.7–10.7)
GFR calc Af Amer: 90
GFR calc non Af Amer: 81.03
Globulin: 1.8

## 2019-06-20 LAB — VITAMIN D 25 HYDROXY (VIT D DEFICIENCY, FRACTURES): Vit D, 25-Hydroxy: 13.5

## 2019-06-20 LAB — CBC: RBC: 3.87 (ref 3.87–5.11)

## 2019-08-10 ENCOUNTER — Non-Acute Institutional Stay (SKILLED_NURSING_FACILITY): Payer: Medicare Other | Admitting: Internal Medicine

## 2019-08-10 ENCOUNTER — Encounter: Payer: Self-pay | Admitting: Internal Medicine

## 2019-08-10 DIAGNOSIS — R1311 Dysphagia, oral phase: Secondary | ICD-10-CM | POA: Diagnosis not present

## 2019-08-10 DIAGNOSIS — I10 Essential (primary) hypertension: Secondary | ICD-10-CM | POA: Diagnosis not present

## 2019-08-10 DIAGNOSIS — F060 Psychotic disorder with hallucinations due to known physiological condition: Secondary | ICD-10-CM

## 2019-08-10 NOTE — Assessment & Plan Note (Addendum)
Her mental status has continued to deteriorate with no focused responses or reality testing.  She confabulates and sings to herself.  Psych NP will continue to follow. DNR status most appropriate clinically.

## 2019-08-10 NOTE — Patient Instructions (Signed)
See assessment and plan under each diagnosis in the problem list and acutely for this visit 

## 2019-08-10 NOTE — Assessment & Plan Note (Signed)
Blood pressure recordings indicate no need for antihypertensives at this time.

## 2019-08-10 NOTE — Assessment & Plan Note (Addendum)
Recently treated by [facility] NP for aspiration pneumonia.  Speech therapy has consulted and prescribed pured/nectar thick diet. Due to her mental state she requiresmonitored feeding.

## 2019-08-10 NOTE — Progress Notes (Signed)
   NURSING HOME LOCATION:  [facility] ROOM NUMBER:  101-B  CODE STATUS:  DNR  PCP:  Pecola Lawless, MD  [address] [place] [address]   This is a nursing facility follow up of chronic medical diagnoses   Interim medical record and care since last [facility] visit was updated with review of diagnostic studies and change in clinical status since last visit were documented.  HPI: She is a permanent resident of the facility with medical diagnoses of essential hypertension,end stage rheumatoid arthritis, PVD, Parkinson's, dyslipidemia, and CKD. Most recent lab studies were approximately 2 months ago.  At that time creatinine was 0.6 with a GFR of 81 indicating normal renal function.  BUN was minimally elevated at 23. Her course was discussed with the [facility] NP who stated that the patient has been treated for aspiration pneumonia.  Speech therapy has been following her and she has been changed to a pured/nectar thick diet.  Speech therapy states that she requires to be fed because of the dysphagia.  Discussions with her HCPOA has resulted in a do not hospitalize status.  Review of systems: Dementia precluded any intelligent responses.   Physical exam:  Pertinent or positive findings: When I entered the room she was talking to herself.  She confabulated nonsensically about visitors and travel.  She has her head and neck deviated laterally to the left suggesting torticollis.  She exhibited some athetoid movements of the extremities.  She lay on the bed with the left upper extremity elevated to the shoulder level and elbow flexed at right angle.  She appears cachectic with gross wasting of the extremities, especially gastrocnemius muscles. The heart sounds are heard best in the left anterior thorax.  Pedal pulses are decreased. Breath sounds are decreased. She has rheumatoid contractures of the hands.Faint bruising R forehead.  General appearance:  no acute distress, increased  work of breathing is present.   Lymphatic: No lymphadenopathy about the head, neck, axilla. Eyes: No conjunctival inflammation or lid edema is present. There is no scleral icterus. Ears:  External ear exam shows no significant lesions or deformities.   Nose:  External nasal examination shows no deformity or inflammation. Nasal mucosa are pink and moist without lesions, exudates Neck:  No thyromegaly, masses, tenderness noted.    Heart:  No gallop, murmur, click, rub .  Lungs:  without wheezes, rhonchi, rales, rubs. Abdomen: Bowel sounds are normal. Abdomen is soft and nontender with no organomegaly, hernias, masses. GU: Deferred  Extremities:  No cyanosis, clubbing, edema  Neurologic exam :Balance, Rhomberg, finger to nose testing could not be completed due to clinical state Skin: Warm & dry w/o tenting. No significant lesions or rash.  See summary under each active problem in the Problem List with associated updated therapeutic plan

## 2019-12-06 DEATH — deceased
